# Patient Record
Sex: Female | Born: 1990 | Race: White | Hispanic: No | Marital: Single | Smoking: Never smoker
Health system: Southern US, Community
[De-identification: ages and names within clinical notes are randomized; demographics above are authoritative.]

## PROBLEM LIST (undated history)

## (undated) DIAGNOSIS — K5900 Constipation, unspecified: Secondary | ICD-10-CM

## (undated) DIAGNOSIS — F419 Anxiety disorder, unspecified: Secondary | ICD-10-CM

## (undated) DIAGNOSIS — F988 Other specified behavioral and emotional disorders with onset usually occurring in childhood and adolescence: Secondary | ICD-10-CM

## (undated) DIAGNOSIS — G809 Cerebral palsy, unspecified: Secondary | ICD-10-CM

## (undated) HISTORY — DX: Other specified behavioral and emotional disorders with onset usually occurring in childhood and adolescence: F98.8

## (undated) HISTORY — DX: Constipation, unspecified: K59.00

## (undated) HISTORY — PX: WISDOM TOOTH EXTRACTION: SHX21

## (undated) HISTORY — PX: HIP DEROTATIONAL OSTEOTOMY: SHX1753

## (undated) HISTORY — DX: Cerebral palsy, unspecified: G80.9

## (undated) HISTORY — DX: Anxiety disorder, unspecified: F41.9

---
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
## 2016-04-21 ENCOUNTER — Encounter: Payer: Self-pay | Admitting: Obstetrics and Gynecology

## 2016-04-21 ENCOUNTER — Ambulatory Visit (INDEPENDENT_AMBULATORY_CARE_PROVIDER_SITE_OTHER): Payer: PRIVATE HEALTH INSURANCE | Admitting: Obstetrics and Gynecology

## 2016-04-21 VITALS — BP 113/79 | HR 80 | Resp 18 | Ht 63.0 in | Wt 193.0 lb

## 2016-04-21 DIAGNOSIS — A499 Bacterial infection, unspecified: Secondary | ICD-10-CM | POA: Diagnosis not present

## 2016-04-21 DIAGNOSIS — B3731 Acute candidiasis of vulva and vagina: Secondary | ICD-10-CM

## 2016-04-21 DIAGNOSIS — B373 Candidiasis of vulva and vagina: Secondary | ICD-10-CM | POA: Diagnosis not present

## 2016-04-21 DIAGNOSIS — B9689 Other specified bacterial agents as the cause of diseases classified elsewhere: Secondary | ICD-10-CM

## 2016-04-21 DIAGNOSIS — N760 Acute vaginitis: Secondary | ICD-10-CM

## 2016-04-21 MED ORDER — METRONIDAZOLE 500 MG PO TABS: 500.0000 mg | ORAL_TABLET | Freq: Two times a day (BID) | ORAL | 3 refills | Status: DC

## 2016-04-21 MED ORDER — FLUCONAZOLE 150 MG PO TABS: 150.0000 mg | ORAL_TABLET | Freq: Once | ORAL | 1 refills | Status: AC

## 2016-04-21 NOTE — Progress Notes (Signed)
Obstetrics and Gynecology Visit New Patient Evaluation  Appointment Date: 04/21/2016  OBGYN Clinic: Center for [facility]  Chief Complaint:  Chief Complaint  Patient presents with  . Vaginal Discharge  and itching  History of Present Illness: Sarah Carroll is a 25 y.o. Caucasian G0 (Patient's last menstrual period was 01/19/2016.), seen for the above chief complaint. Her past medical history is significant for history of BV and VVC in the past when I saw her at [facility].  Last s/s last year and current s/s have been ongoing for the past few weeks with some malodorous vaginal discharge and itching. Diflucan and flagyl have worked in the past. She doesn't douche but does use Summer's Even wash in the GU area. She is on loloestrin and when she does have a period it sounds as if it's light, not painful and it is predictable. She would like to switch to an IUD but is waiting for her insurance to switch for better coverage. She does have some breast tenderness around the time of her periods but no discharge or lumps/bumps. She does use condoms and water based lubricants and have been using the same for several years.   No fevers, chills, nausea, vomiting, abdominal pain, dysuria, hematuria, chest pain, SOB   Review of Systems:  Her 12 point review of systems is negative or as noted in the History of Present Illness.  Past Medical History:  Past Medical History:  Diagnosis Date  . ADD (attention deficit disorder)   . Anxiety   . Cerebral palsy (HCC)   . Constipation     Past Surgical History:  Past Surgical History:  Procedure Laterality Date  . HIP DEROTATIONAL OSTEOTOMY    . WISDOM TOOTH EXTRACTION      Past Obstetrical History:  OB History  Gravida Para Term Preterm AB Living  0 0 0 0 0 0  SAB TAB Ectopic Multiple Live Births  0 0 0 0 0        Past Gynecological History: As per HPI. No abnormal paps or STIs. Paps UTD per patient  Social History:   Social History   Social History  . Marital status: Significant Other    Spouse name: N/A  . Number of children: N/A  . Years of education: N/A   Occupational History  . Not on file.   Social History Main Topics  . Smoking status: Never Smoker  . Smokeless tobacco: Never Used  . Alcohol use Yes     Comment: occasional  . Drug use: No  . Sexual activity: Yes    Birth control/ protection: Pill   Other Topics Concern  . Not on file   Social History Narrative  . No narrative on file    Family History:  Family History  Problem Relation Age of Onset  . Hypertension Father   . Hypertension Mother      Medications: lo-loestrin Allergies Morphine and related   Physical Exam:  BP 113/79 (BP Location: Left Arm, Patient Position: Sitting, Cuff Size: Normal)   Pulse 80   Resp 18   Ht 5\' 3"  (1.6 m)   Wt 193 lb (87.5 kg)   LMP 01/19/2016   BMI 34.19 kg/m  Body mass index is 34.19 kg/m. General appearance: Well nourished, well developed female in no acute distress.  Neck:  Supple, normal appearance, and no thyromegaly  Respiratory:  Normal respiratory effort Abdomen: positive bowel sounds and no masses, hernias; diffusely non tender to palpation, non distended  Neuro/Psych:  Normal mood and affect.  Skin:  Warm and dry.  Lymphatic:  No inguinal lymphadenopathy.   Pelvic exam: is not limited by body habitus EGBUS: within normal limits Vagina: within normal limits and with no blood in the vault but white, runny and clumpy discharge in vault, Cervix:  no lesions or cervical motion tenderness Uterus:  nonenlarged and approximately 6-8 week sized Adnexa:  normal adnexa and no mass, fullness, tenderness Rectovaginal: deferred  Laboratory: none  Radiology: none  Assessment: BV and VVC  Plan:  S/s aren't recurrent so will do diflucan and flagyl. Pt told if s/s recur to do repeat Rx and if no relief, then call for evaluation. Also told to d/c Summer's Eve and try Kefer  a few times a week for probiotic flora. Pt declines gc/ct testing. Pt to sign record release for [facility]  RTC prn  Cornelia Copa MD Attending Center for [facility])

## 2016-05-03 ENCOUNTER — Telehealth: Payer: Self-pay | Admitting: *Deleted

## 2016-05-03 NOTE — Telephone Encounter (Signed)
-----   Message from Olevia BowensJacinda S Battle sent at 05/03/2016  1:20 PM EDT ----- Regarding: Advise Contact: [phone] Left message with after hours answering service, Have a question about her RX

## 2016-05-03 NOTE — Telephone Encounter (Signed)
Called pt, no answer, unable to leave message due to voicemail not being set up.

## 2016-05-14 ENCOUNTER — Emergency Department: Payer: 59

## 2016-05-14 ENCOUNTER — Emergency Department
Admission: EM | Admit: 2016-05-14 | Discharge: 2016-05-14 | Payer: 59 | Attending: Emergency Medicine | Admitting: Emergency Medicine

## 2016-05-14 DIAGNOSIS — W010XXA Fall on same level from slipping, tripping and stumbling without subsequent striking against object, initial encounter: Secondary | ICD-10-CM | POA: Insufficient documentation

## 2016-05-14 DIAGNOSIS — R52 Pain, unspecified: Secondary | ICD-10-CM

## 2016-05-14 DIAGNOSIS — M25551 Pain in right hip: Secondary | ICD-10-CM | POA: Diagnosis present

## 2016-05-14 DIAGNOSIS — S79091A Other physeal fracture of upper end of right femur, initial encounter for closed fracture: Secondary | ICD-10-CM | POA: Diagnosis not present

## 2016-05-14 DIAGNOSIS — F909 Attention-deficit hyperactivity disorder, unspecified type: Secondary | ICD-10-CM | POA: Insufficient documentation

## 2016-05-14 DIAGNOSIS — M898X5 Other specified disorders of bone, thigh: Secondary | ICD-10-CM

## 2016-05-14 DIAGNOSIS — W19XXXA Unspecified fall, initial encounter: Secondary | ICD-10-CM

## 2016-05-14 DIAGNOSIS — Y92162 Bathroom in school dormitory as the place of occurrence of the external cause: Secondary | ICD-10-CM | POA: Insufficient documentation

## 2016-05-14 DIAGNOSIS — S72001A Fracture of unspecified part of neck of right femur, initial encounter for closed fracture: Secondary | ICD-10-CM

## 2016-05-14 DIAGNOSIS — Y939 Activity, unspecified: Secondary | ICD-10-CM | POA: Insufficient documentation

## 2016-05-14 DIAGNOSIS — Y999 Unspecified external cause status: Secondary | ICD-10-CM | POA: Insufficient documentation

## 2016-05-14 LAB — CBC WITH DIFFERENTIAL/PLATELET
Basophils Absolute: 0.1 10*3/uL (ref 0–0.1)
Basophils Relative: 1 %
EOS ABS: 0.1 10*3/uL (ref 0–0.7)
EOS PCT: 1 %
HCT: 38.3 % (ref 35.0–47.0)
Hemoglobin: 13.1 g/dL (ref 12.0–16.0)
LYMPHS ABS: 1.1 10*3/uL (ref 1.0–3.6)
LYMPHS PCT: 14 %
MCH: 28.5 pg (ref 26.0–34.0)
MCHC: 34.3 g/dL (ref 32.0–36.0)
MCV: 82.9 fL (ref 80.0–100.0)
MONO ABS: 0.5 10*3/uL (ref 0.2–0.9)
MONOS PCT: 6 %
Neutro Abs: 6.3 10*3/uL (ref 1.4–6.5)
Neutrophils Relative %: 78 %
PLATELETS: 246 10*3/uL (ref 150–440)
RBC: 4.62 MIL/uL (ref 3.80–5.20)
RDW: 13.5 % (ref 11.5–14.5)
WBC: 8.1 10*3/uL (ref 3.6–11.0)

## 2016-05-14 LAB — TYPE AND SCREEN
ABO/RH(D): O POS
Antibody Screen: NEGATIVE

## 2016-05-14 LAB — BASIC METABOLIC PANEL
Anion gap: 12 (ref 5–15)
BUN: 12 mg/dL (ref 6–20)
CO2: 19 mmol/L — ABNORMAL LOW (ref 22–32)
CREATININE: 0.56 mg/dL (ref 0.44–1.00)
Calcium: 9.2 mg/dL (ref 8.9–10.3)
Chloride: 109 mmol/L (ref 101–111)
GFR calc Af Amer: >60 mL/min (ref >60)
GLUCOSE: 108 mg/dL — AB (ref 65–99)
POTASSIUM: 4 mmol/L (ref 3.5–5.1)
SODIUM: 140 mmol/L (ref 135–145)

## 2016-05-14 LAB — HCG, QUANTITATIVE, PREGNANCY

## 2016-05-14 MED ORDER — DIAZEPAM 5 MG/ML IJ SOLN
3.0000 mg | Freq: Once | INTRAMUSCULAR | Status: AC
  Administered 2016-05-14: 3 mg via INTRAVENOUS

## 2016-05-14 MED ORDER — ONDANSETRON HCL 4 MG/2ML IJ SOLN
INTRAMUSCULAR | Status: AC
  Administered 2016-05-14: 4 mg via INTRAVENOUS
  Filled 2016-05-14: qty 2

## 2016-05-14 MED ORDER — FENTANYL CITRATE (PF) 100 MCG/2ML IJ SOLN
50.0000 ug | Freq: Once | INTRAMUSCULAR | Status: AC
  Administered 2016-05-14: 50 ug via INTRAVENOUS

## 2016-05-14 MED ORDER — FENTANYL CITRATE (PF) 100 MCG/2ML IJ SOLN
50.0000 ug | Freq: Once | INTRAMUSCULAR | Status: DC
  Filled 2016-05-14: qty 2

## 2016-05-14 MED ORDER — FENTANYL CITRATE (PF) 100 MCG/2ML IJ SOLN
50.0000 ug | Freq: Once | INTRAMUSCULAR | Status: AC
  Administered 2016-05-14: 50 ug via INTRAVENOUS
  Filled 2016-05-14: qty 2

## 2016-05-14 MED ORDER — HYDROMORPHONE HCL 1 MG/ML IJ SOLN
0.5000 mg | Freq: Once | INTRAMUSCULAR | Status: AC
  Administered 2016-05-14: 0.5 mg via INTRAVENOUS
  Filled 2016-05-14: qty 1

## 2016-05-14 MED ORDER — DIAZEPAM 5 MG/ML IJ SOLN
3.0000 mg | Freq: Once | INTRAMUSCULAR | Status: AC
  Administered 2016-05-14: 3 mg via INTRAVENOUS
  Filled 2016-05-14: qty 2

## 2016-05-14 MED ORDER — HYDROMORPHONE HCL 1 MG/ML IJ SOLN
1.0000 mg | Freq: Once | INTRAMUSCULAR | Status: AC
  Administered 2016-05-14: 1 mg via INTRAVENOUS
  Filled 2016-05-14: qty 1

## 2016-05-14 MED ORDER — HYDROMORPHONE HCL 1 MG/ML IJ SOLN
1.0000 mg | Freq: Once | INTRAMUSCULAR | Status: AC
  Administered 2016-05-14: 1 mg via INTRAVENOUS

## 2016-05-14 MED ORDER — HYDROMORPHONE HCL 1 MG/ML IJ SOLN
1.0000 mg | Freq: Once | INTRAMUSCULAR | Status: AC
Start: 2016-05-14 — End: 2016-05-14
  Administered 2016-05-14: 1 mg via INTRAVENOUS
  Filled 2016-05-14: qty 1

## 2016-05-14 MED ORDER — HYDROMORPHONE HCL 1 MG/ML IJ SOLN
INTRAMUSCULAR | Status: AC
Start: 2016-05-14 — End: 2016-05-14
  Administered 2016-05-14: 1 mg via INTRAVENOUS
  Filled 2016-05-14: qty 1

## 2016-05-14 MED ORDER — ONDANSETRON HCL 4 MG/2ML IJ SOLN
4.0000 mg | Freq: Once | INTRAMUSCULAR | Status: AC
  Administered 2016-05-14: 4 mg via INTRAVENOUS

## 2016-05-14 MED ORDER — DIAZEPAM 5 MG/ML IJ SOLN
2.0000 mg | Freq: Once | INTRAMUSCULAR | Status: AC
  Administered 2016-05-14: 2 mg via INTRAVENOUS
  Filled 2016-05-14: qty 2

## 2016-05-14 NOTE — ED Notes (Signed)
Called [facility] to let them know patient en route to facility.

## 2016-05-14 NOTE — ED Notes (Signed)
Pt reports increased muscle spasms and pain. MD notified. Will give pt more medication.

## 2016-05-14 NOTE — ED Notes (Signed)
Pt transported to xray 

## 2016-05-14 NOTE — Consult Note (Signed)
ORTHOPAEDIC CONSULTATION  REQUESTING PHYSICIAN: Minna Antis, MD ER physician  Chief Complaint: Right hip pain status post fall  HPI: Sarah Carroll is a 25 y.o. female who complains of right hip pain status post fall at school today.  Patient states that she was walking into the bathroom and believes she caught her right foot on the threshold of the doorway which caused her to lose her balance. She fell onto the right side and had immediate pain in the right hip. Patient has a history of cerebral palsy with a prior derotational osteotomy of the right proximal femur performed at [facility] by Dr.Campion 8 years ago. Patient has a plate and screws in the right femur. Patient states that she has a chronic hip flexion contracture but does walk independently without an assistive device. She is able to bear weight on her right lower extremity. Patient denies any numbness or tingling in the lower extremity. She is seen in the ER room 1 with her family at the bedside.  Past Medical History:  Diagnosis Date  . ADD (attention deficit disorder)   . Anxiety   . Cerebral palsy (HCC)   . Constipation    Past Surgical History:  Procedure Laterality Date  . HIP DEROTATIONAL OSTEOTOMY    . WISDOM TOOTH EXTRACTION     Social History   Social History  . Marital status: Significant Other    Spouse name: N/A  . Number of children: N/A  . Years of education: N/A   Social History Main Topics  . Smoking status: Never Smoker  . Smokeless tobacco: Never Used  . Alcohol use Yes     Comment: occasional  . Drug use: No  . Sexual activity: Yes    Birth control/ protection: Pill   Other Topics Concern  . None   Social History Narrative  . None   Family History  Problem Relation Age of Onset  . Hypertension Father   . Hypertension Mother    Allergies  Allergen Reactions  . Morphine And Related Itching   Prior to Admission medications   Medication Sig Start Date End Date Taking? Authorizing  Provider  Norethindrone-Ethinyl Estradiol-Fe Biphas (LO LOESTRIN FE) 1 MG-10 MCG / 10 MCG tablet Take 1 tablet by mouth daily. 06/11/15  Yes Historical Provider, MD  metroNIDAZOLE (FLAGYL) 500 MG tablet Take 1 tablet (500 mg total) by mouth 2 (two) times daily. Patient not taking: Reported on 05/14/2016 04/21/16   [place] Bing, MD   Ct Femur Right Wo Contrast  Result Date: 05/14/2016 CLINICAL DATA:  Right hip pain secondary to a fall at school today. Previous derotational osteotomy on the proximal right femur. EXAM: CT OF THE RIGHT FEMUR WITHOUT CONTRAST TECHNIQUE: Multidetector CT imaging was performed according to the standard protocol. Multiplanar CT image reconstructions were also generated. COMPARISON:  Radiographs dated 05/14/2016 FINDINGS: There is a comminuted slightly impacted intertrochanteric fracture of the proximal right femur. The fracture also extends 11 cm down the femoral shaft from the tip of the greater trochanter. There is a slightly expansile the 7 x 4 x 4 cm cystic lesion in the greater trochanteric area of the proximal femur extending into the right femoral neck and shaft. 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 There is a side plate with multiple screws in the proximal right femur. Osteotomy site has completely healed. Distal femur is osteopenic but intact. Visualized pelvic bones are normal. IMPRESSION: Comminuted impacted fracture of the proximal right femur. 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 Electronically Signed   By: Francene BoyersJames  Maxwell M.D.   On: 05/14/2016 15:47   Dg Femur Min 2 Views Right  Result Date: 05/14/2016 CLINICAL DATA:  Fall. EXAM: RIGHT FEMUR 2 VIEWS COMPARISON:  No recent prior. FINDINGS: Plate and screw fixation proximal femur. No acute bony abnormality identified. Deformity noted about the hip and knee consistent patient's known cervical palsy . IMPRESSION: No acute abnormality.  Electronically Signed   By: Maisie Fushomas  Register   On: 05/14/2016 12:41    Positive ROS: All other systems have been reviewed and were otherwise negative with the exception of those mentioned in the HPI and as above.  Physical Exam: General: Alert, no acute distress  MUSCULOSKELETAL: Right lower extremity: Patient is laying on her left side. Her right lower extremity is laying on top of the left lower extremity with flexion of the hip of approximately 45 and the knee approximately 90. She has intact skin. Distally she has palpable pedal pulses, intact sensation light touch and can flex and extend her toes. There is no shortening or external rotation of the right lower extremity seen.  Assessment: Right proximal femur fracture Cerebral palsy affecting right lower extremity Interosseus lesion in the proximal femur  Plan: I reviewed the x-rays and the CT scan of the right hip taken in the ER. The patient has a fracture of the proximal femur which extends into the intertrochanteric region and exits medially near the base of the femoral neck. The fracture extends into the femoral shaft 11 mm distal to the greater trochanter alongside her osteotomy plate.  The plate does not appear to be loosened. Patient has a significant expansile lesion of the proximal femur extending into the femoral neck and head and does extend into the region of the fracture. The radiologist believes this may be an aneurysmal bone cyst. However and interosseous expansile tumor cannot be excluded including fibrous dysplasia or other neoplasm.  I have explained the findings of these radiologic studies to the patient and her family. She will likely need surgery to involve removal of her existing plate and fixation of the fracture which will include extension more distally with an intramedullary rod versus long plate. The interosseus lesion will likely need to be identified and will likely require biopsy at the time of surgery. Her  cerebral palsy adds additional surgical challenges based on increased muscle tone and flexion contractures. For all these reasons I'm recommending the patient be transferred to [facility] for definitive management of her fracture. I have discussed this case with Dr. Lenard LancePaduchowski who will arrange for transfer. The family understood and agreed with this plan. They understand that if a [facility] is not available to accept transfer of the patient, that she will be admitted here for pain control and a further treatment plan devised.Marland Kitchen.    Juanell FairlyKRASINSKI, Elmon Shader, MD    05/14/2016 5:18 PM

## 2016-05-14 NOTE — ED Triage Notes (Addendum)
Pt came to ED via EMS from [facility]. Was at school when had a mechanical fall. C/o right hip pain. History of CP and right hip surgery 15 years ago. Given 100mcg fentanyl.

## 2016-05-14 NOTE — ED Provider Notes (Signed)
[facility] Emergency Department Provider Note   ____________________________________________   First MD Initiated Contact with Patient 05/14/16 1048     (approximate)  I have reviewed the triage vital signs and the nursing notes.   HISTORY  Chief Complaint Hip Pain and Fall   HPI Sarah Carroll is a 25 y.o. female with a history of cerebral palsy as well as right-sided hip surgery with hardware at 25 years old who is presenting to the emergency department after a fall today with right femur pain She said that she had a trip and fall the bathroom at her school. She landed on her right femur and hip and has been unable to move it since without a great degree of pain. She denies hitting her head or any other part of her body and says that the pain is concentrated to the right femur. Given 100 g of fentanyl en route with EMS.   Past Medical History:  Diagnosis Date  . ADD (attention deficit disorder)   . Anxiety   . Cerebral palsy (HCC)   . Constipation     There are no active problems to display for this patient.   Past Surgical History:  Procedure Laterality Date  . HIP DEROTATIONAL OSTEOTOMY    . WISDOM TOOTH EXTRACTION      Prior to Admission medications   Medication Sig Start Date End Date Taking? Authorizing Provider  Norethindrone-Ethinyl Estradiol-Fe Biphas (LO LOESTRIN FE) 1 MG-10 MCG / 10 MCG tablet Take 1 tablet by mouth daily. 06/11/15  Yes Historical Provider, MD  metroNIDAZOLE (FLAGYL) 500 MG tablet Take 1 tablet (500 mg total) by mouth 2 (two) times daily. Patient not taking: Reported on 05/14/2016 04/21/16   [place] Bingharlie Pickens, MD    Allergies Morphine and related  Family History  Problem Relation Age of Onset  . Hypertension Father   . Hypertension Mother     Social History Social History  Substance Use Topics  . Smoking status: Never Smoker  . Smokeless tobacco: Never Used  . Alcohol use Yes     Comment: occasional     Review of Systems Constitutional: No fever/chills Eyes: No visual changes. ENT: No sore throat. Cardiovascular: Denies chest pain. Respiratory: Denies shortness of breath. Gastrointestinal: No abdominal pain.  No nausea, no vomiting.  No diarrhea.  No constipation. Genitourinary: Negative for dysuria. Musculoskeletal: Negative for back pain. Skin: Negative for rash. Neurological: Negative for headaches, focal weakness or numbness.  10-point ROS otherwise negative.  ____________________________________________   PHYSICAL EXAM:  VITAL SIGNS: ED Triage Vitals [05/14/16 1044]  Enc Vitals Group     BP 134/90     Pulse Rate 90     Resp 16     Temp 98.3 F (36.8 C)     Temp Source Oral     SpO2 100 %     Weight      Height      Head Circumference      Peak Flow      Pain Score      Pain Loc      Pain Edu?      Excl. in GC?     Constitutional: Alert and oriented. Well appearing and in no acute distress.Lying on her left side to avoid pressure to the right femur and hip. Eyes: Conjunctivae are normal. PERRL. EOMI. Head: Atraumatic. Nose: No congestion/rhinnorhea. Mouth/Throat: Mucous membranes are moist.   Neck: No stridor.   Cardiovascular: Normal rate, regular rhythm. Grossly normal  heart sounds.  Good peripheral circulation with equal and bilateral dorsalis pedis pulses. Respiratory: Normal respiratory effort.  No retractions. Lungs CTAB. Gastrointestinal: Soft and nontender. No distention.  Musculoskeletal: No deformity or ecchymosis noted to the right hip or femur. Patient is holding her right lower extremity in flexion at both the hip and the knee. She is tender to the mid shaft femur laterally. She is neurovascularly intact distal to the site of the pain. I'm unable to passively range the right lower extremity secondary to pain To the mid shaft femur.  Neurologic:  Normal speech and language. No gross focal neurologic deficits are appreciated.  Skin:  Skin is  warm, dry and intact. No rash noted. Psychiatric: Mood and affect are normal. Speech and behavior are normal.  ____________________________________________   LABS (all labs ordered are listed, but only abnormal results are displayed)  Labs Reviewed  BASIC METABOLIC PANEL - Abnormal; Notable for the following:       Result Value   CO2 19 (*)    Glucose, Bld 108 (*)    All other components within normal limits  CBC WITH DIFFERENTIAL/PLATELET  HCG, QUANTITATIVE, PREGNANCY  TYPE AND SCREEN   ____________________________________________  EKG   ____________________________________________  RADIOLOGY  DG Femur Min 2 Views Right (Accession [id number]) (Order [id number])  Imaging  Date: 05/14/2016 Department: [facility] EMERGENCY DEPARTMENT Released By/Authorizing: Myrna Blazer, MD (auto-released)  PACS Images   Show images for DG Femur Min 2 Views Right  Study Result   CLINICAL DATA:  Fall.  EXAM: RIGHT FEMUR 2 VIEWS  COMPARISON:  No recent prior.  FINDINGS: Plate and screw fixation proximal femur. No acute bony abnormality identified. Deformity noted about the hip and knee consistent patient's known cervical palsy .  IMPRESSION: No acute abnormality.   Electronically Signed   By: Maisie Fus  Register   On: 05/14/2016 12:41    ____________________________________________   PROCEDURES  Procedure(s) performed:   Procedures  Critical Care performed:   ____________________________________________   INITIAL IMPRESSION / ASSESSMENT AND PLAN / ED COURSE  Pertinent labs & imaging results that were available during my care of the patient were reviewed by me and considered in my medical decision making (see chart for details).  ----------------------------------------- 3:23 PM on 05/14/2016 -----------------------------------------  Discussed case with Dr. Lenard Lance who will be following the patient's imaging. Negative  x-rays the patient requiring multiple rounds of pain medications as well as muscle relaxers. Suspect possible occult fracture not seen on the patient's x-rays.  Clinical Course     ____________________________________________   FINAL CLINICAL IMPRESSION(S) / ED DIAGNOSES  Final diagnoses:  Pain in right femur  Mechanical fall.    NEW MEDICATIONS STARTED DURING THIS VISIT:  New Prescriptions   No medications on file     Note:  This document was prepared using Dragon voice recognition software and may include unintentional dictation errors.    Myrna Blazer, MD 05/14/16 [phone]

## 2016-05-14 NOTE — ED Provider Notes (Signed)
-----------------------------------------   7:41 PM on 05/14/2016 -----------------------------------------  CT scan consistent with comminuted impacted fracture of the proximal right femur. I discussed the patient with Dr. Martha ClanKrasinski who believes the repair will be too complicated/complex and the patient would benefit from transfer to a tertiary care center. I [facility] the [facility] as [facility] and the patient was followed by Dr. Adah Salvageampion up until a few years ago. I discussed the patient with [facility] was excepted the patient pending an ER to ER transfer. I discussed the patient with Dr. Mayford KnifeWilliams in the emergency Department has accepted the patient ER to ER until a bed becomes available for the patient. I discussed this with the patient and her family and they're both agreeable to this plan.   Minna AntisKevin Naevia Unterreiner, MD 05/14/16 [phone]

## 2016-05-17 ENCOUNTER — Encounter: Payer: Self-pay | Admitting: *Deleted

## 2016-06-21 ENCOUNTER — Telehealth: Payer: Self-pay | Admitting: *Deleted

## 2016-06-21 DIAGNOSIS — N760 Acute vaginitis: Secondary | ICD-10-CM

## 2016-06-21 MED ORDER — FLUCONAZOLE 150 MG PO TABS: 150.0000 mg | ORAL_TABLET | Freq: Once | ORAL | 0 refills | Status: AC

## 2016-06-21 MED ORDER — METRONIDAZOLE 500 MG PO TABS: 500.0000 mg | ORAL_TABLET | Freq: Two times a day (BID) | ORAL | 0 refills | Status: DC

## 2016-06-21 NOTE — Telephone Encounter (Signed)
Pt called c/o white vaginal discharge and vaginal odor.  Is currently recovering from a fractured femur.  Has a hx of recurrent vaginitis.  Sent Flagyl and Diflucan to pharmacy.  Instructed pt on medication use and instructed that after she heals if she continues to have recurrent infections to call and schedule appt for evaluation.

## 2016-07-07 ENCOUNTER — Encounter: Payer: Self-pay | Admitting: Physical Therapy

## 2016-07-07 ENCOUNTER — Ambulatory Visit: Payer: PRIVATE HEALTH INSURANCE | Attending: Orthopedic Surgery | Admitting: Physical Therapy

## 2016-07-07 DIAGNOSIS — M25551 Pain in right hip: Secondary | ICD-10-CM | POA: Diagnosis not present

## 2016-07-07 DIAGNOSIS — M6281 Muscle weakness (generalized): Secondary | ICD-10-CM

## 2016-07-07 DIAGNOSIS — R262 Difficulty in walking, not elsewhere classified: Secondary | ICD-10-CM

## 2016-07-07 NOTE — Patient Instructions (Signed)
[email or web address]  R SLR with assistance through full range Bridging Continue current exercises: quad set, supine hip abd, SAQ

## 2016-07-07 NOTE — Therapy (Signed)
[facility] PHYSICAL AND SPORTS MEDICINE [address] [place], [place], [address] Phone: [phone]   Fax:  [phone]  Physical Therapy Evaluation  Patient Details  Name: Sarah ChromanMadison E Rief MRN: [id number] Date of Birth: 05/25/1991 Referring Provider: Brion Alimentobert Ostrum  Encounter Date: 07/07/2016      PT End of Session - 07/07/16 1603    Visit Number 1   Number of Visits 17   Date for PT Re-Evaluation 08/04/16   PT Start Time 1432   PT Stop Time 1532   PT Time Calculation (min) 60 min   Equipment Utilized During Treatment Gait belt   Activity Tolerance Patient tolerated treatment well   Behavior [facility] Therapy [facility] for tasks assessed/performed      Past Medical History:  Diagnosis Date  . ADD (attention deficit disorder)   . Anxiety   . Cerebral palsy (HCC)   . Constipation     Past Surgical History:  Procedure Laterality Date  . HIP DEROTATIONAL OSTEOTOMY    . WISDOM TOOTH EXTRACTION      There were no vitals filed for this visit.       Subjective Assessment - 07/07/16 1554    Subjective s/p R femur fracture and internal fixation   Pertinent History Pt reports falling during the first week of school while in the bathroom. She "opened the bathroom door kinda weird and stepped into it kinda weird" and she thinks the door pushed her over, causing her to fall directly onto her R hip. Prior to the fall, she did not ambulate with an AD. She states that she was told to wear AFOs, but she just never did because they were hard to put on. She wore them until she was 25 YO. She states she did not drive prior to the fall but thinks she will be able to after rehab. She reports living with her parents, but was fully independent. She states prior to the fall, she could walk as much as she wanted to. Following surgery, [facility]; she was discharged 06/05/16. She has just been WBAT since coming home from rehab and was  trying to walk as much as possible with her FWW. She states that she has been trying to stretch it out as well. 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 She states that she needs some quad strengthening. She denies any falls since the surgery. She reports having some pain, especially after doing a lot, but for the most part it is just achy. She has a ramp at her house, but at her boyfriend's house there are 2 STE which she can ambulate with her FWW. She had a previous surgery on her RLE when she was 9 or 25 YO, where they had to replace a plate with a rod.   Limitations Walking;Standing;House hold activities   How long can you walk comfortably? 200-300 ft   Patient Stated Goals walk without AD, get back to how she was before surgery   Currently in Pain? No/denies            [facility] Assessment - 07/07/16 0001      Assessment   Medical Diagnosis s/p femur surgery (rebuilt femur)   Referring Provider Brion Alimentobert Ostrum   Onset Date/Surgical Date 05/17/16   Prior Therapy yes, but not for current issue     Precautions   Precautions  Fall     Restrictions   Weight Bearing Restrictions No     Balance Screen   Has the patient fallen in the past 6 months Yes   How many times? 1   Has the patient had a decrease in activity level because of a fear of falling?  Yes   Is the patient reluctant to leave their home because of a fear of falling?  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   Leisure movies, visiting with friends     Cognition   Overall Cognitive Status Within Functional Limits for tasks assessed      SENSATION  WNL BLE  A/PROM  Left Right   Hip flexion WNL AROM limited due to weakness; PROM to 90 deg before pt reported anterior hip pain/"pinching"  Hip extension WNL WNL  Hip IR/ER    Hip abduction WNL AROM limited due to weakness  Hip adduction    Knee flexion WNL WNL  Knee extension +12 hyper -2 from neutral   Ankle DF Limited due to weakness, baseline Limited due to weakness, baseline        STRENGTH (on scale of 0-5/5)  Left Right  Hip flexion 4+ 2+ (unable to perform through full range, but pt had good strength through available range)  Hip extension 4+ 3  Hip IR/ER    Hip abduction 4+ 2+ (unable to perform through full range)  Hip adduction    Knee flexion 5 4  Knee extension 5 4  Ankle DF 4+ 4  Ankle PF             Pt unable to perform R SLR without assistance due to weakness  PALPATION/OBSERVATION Decreased quad set on R compared to L Incisions are well healed  GAIT Pt ambulates with FWW and demonstrates increased foot pronation, toe out, knee valgus, hip IR, trendelenberg, pelvic rotation, and decreased DF on the RLE; decreased DF, increased knee hyperextension, knee valgus on the LLE  Pt able to ambulate with quad cane during session this date with same deficits as above, but at slower pace and required mod verbal cues for proper sequencing   BALANCE Not assessed this date, will assess during future sessions  OUTCOME MEASURES: : 628 ft with FWW 5xSTS: 16.03 sec, no UE support   Therex: Went over HEP: R SLR and bridging Continue performing supine hip abd, quad sets, and SAQ on RLE         PT Education - 07/07/16 1603    Education provided Yes   Education Details POC, HEP   Person(s) Educated Patient   Methods Explanation   Comprehension Verbalized understanding             PT Long Term  Goals - 07/07/16 1615      PT LONG TERM GOAL #1   Title Pt will have improved 6 minute walk test by >568ft with LRAD to demonstrate improved strength, endurance, and overall function and community  ambulation.   Baseline 10/18: 6105ft with FWW   Time 8   Period Weeks   Status New     PT LONG TERM GOAL #2   Title Pt will have improved 5xSTS to <10 seconds to demonstrate improved overall strength and function.   Baseline 10/18: 16.03 seconds   Time 8   Period Weeks   Status New     PT LONG TERM GOAL #3   Title Pt will have improved LEFS score by >10 points to demonstrate improved overall function.   Baseline 10/18: 21/80   Time 8   Period Weeks   Status New               Plan - 07/07/16 1604    Clinical Impression Statement Pt is pleasant 25 YO F with CP who presents to therapy s/p R femur fracture after a fall while in the bathroom at school. She had a rod inserted on 05/17/16. Pt is WBAT on RLE and is currently ambulating with FWW. Pt has deficits in R quad and hip flexion strength, AROM due to weakness, functional strength and in gait. Pt was completely independent prior to her fall and could ambulate without and AD, but is currently ambulating with a FWW. She was able to ambulate with a quad cane this date with SBA. Pt needs skilled PT intervention to maximize strength and overall function.   Rehab Potential Good   Clinical Impairments Affecting Rehab Potential young, motivated, active   PT Frequency 2x / week   PT Duration 8 weeks   PT Treatment/Interventions ADLs/Self Care Home Management;Cryotherapy;Moist Heat;Ultrasound;DME Instruction;Gait training;Stair training;Functional mobility training;Therapeutic activities;Therapeutic exercise;Electrical Stimulation;Balance training;Neuromuscular re-education;Patient/family education;Orthotic Fit/Training;Manual techniques;Passive range of motion   PT Next Visit Plan strengthening, stretching, gait training   PT Home Exercise Plan  10/18: assisted SLR, bridging, and to continue her current HEP (quad sets, SAQ, supine hip abd)   Consulted and Agree with Plan of Care Patient      Patient will benefit from skilled therapeutic intervention in order to improve the following deficits and impairments:  Abnormal gait, Decreased activity tolerance, Decreased balance, Decreased mobility, Decreased endurance, Decreased range of motion, Decreased strength, Difficulty walking, Impaired flexibility  Visit Diagnosis: Pain in right hip  Difficulty in walking, not elsewhere classified  Muscle weakness (generalized)     Problem List There are no active problems to display for this patient.  Jac Canavan, SPT  Jac Canavan 07/07/2016, 4:21 PM  [facility] PHYSICAL AND SPORTS MEDICINE [address] [place], [address] Phone: [phone]   Fax:  [phone]  Name: DIEDRE MACLELLAN MRN: [id number] Date of Birth: 1990/11/06

## 2016-07-12 ENCOUNTER — Ambulatory Visit: Payer: PRIVATE HEALTH INSURANCE | Admitting: Physical Therapy

## 2016-07-14 ENCOUNTER — Ambulatory Visit: Payer: PRIVATE HEALTH INSURANCE | Admitting: Physical Therapy

## 2016-07-14 DIAGNOSIS — M25551 Pain in right hip: Secondary | ICD-10-CM

## 2016-07-14 DIAGNOSIS — M6281 Muscle weakness (generalized): Secondary | ICD-10-CM

## 2016-07-14 DIAGNOSIS — R262 Difficulty in walking, not elsewhere classified: Secondary | ICD-10-CM

## 2016-07-14 NOTE — Patient Instructions (Addendum)
Toe taps to the side  Gait assessment -   TKE's   Sit to stand from green chair   Sit to stands x 15 (with red band around thighs)   TRX Sit to stands x 15 for 2 sets with red theraband

## 2016-07-15 NOTE — Therapy (Signed)
[facility] PHYSICAL AND SPORTS MEDICINE [address], [place], [address] Phone: [phone]   Fax:  [phone]  Physical Therapy Treatment  Patient Details  Name: Sarah Carroll MRN: [id number] Date of Birth: 24-Apr-1991 Referring Provider: Brion Aliment  Encounter Date: 07/14/2016      PT End of Session - 07/14/16 1030    Visit Number 2   Number of Visits 17   Date for PT Re-Evaluation 08/04/16   PT Start Time 1000   PT Stop Time 1035   PT Time Calculation (min) 35 min   Equipment Utilized During Treatment Gait belt   Activity Tolerance Patient tolerated treatment well   Behavior During Therapy [facility] for tasks assessed/performed      Past Medical History:  Diagnosis Date  . ADD (attention deficit disorder)   . Anxiety   . Cerebral palsy (HCC)   . Constipation     Past Surgical History:  Procedure Laterality Date  . HIP DEROTATIONAL OSTEOTOMY    . WISDOM TOOTH EXTRACTION      There were no vitals filed for this visit.      Subjective Assessment - 07/14/16 1003    Subjective Patient reports having had 2 falls since her evaluation. She has been getting a muscle sore since her evaluation, no residual pain from the falls. She continues to use RW for all mobility.    Pertinent History Pt reports falling during the first week of school while in the bathroom. She "opened the bathroom door kinda weird and stepped into it kinda weird" and she thinks the door pushed her over, causing her to fall directly onto her R hip. Prior to the fall, she did not ambulate with an AD. She states that she was told to wear AFOs, but she just never did because they were hard to put on. She wore them until she was 25 YO. She states she did not drive prior to the fall but thinks she will be able to after rehab. She reports living with her parents, but was fully independent. She states prior to the fall, she could walk as much as she wanted to. Following  surgery, she went to a rehab facility at [facility] for 1 week; she was discharged 06/05/16. She has just been WBAT since coming home from rehab and was trying to walk as much as possible with her FWW. She states that she has been trying to stretch it out as well. 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 She states that she needs some quad strengthening. She denies any falls since the surgery. She reports having some pain, especially after doing a lot, but for the most part it is just achy. She has a ramp at her house, but at her boyfriend's house there are 2 STE which she can ambulate with her FWW. She had a previous surgery on her RLE when she was 9 or 25 YO, where they had to replace a plate with a rod.   Limitations Walking;Standing;House hold activities   How long can you walk comfortably? 200-300 ft   Patient Stated Goals walk without AD, get back to how she was before surgery   Currently in Pain? No/denies  Patient reports she has soreness in her R thigh (posterior thigh musculature, anterior proximal thigh) from exercises last night.       Toe taps to the side bilaterally with HHA from walker  x 15 for 3 sets bilaterally  Gait assessment - crouched gait with knee flexion R>L and Trendelenburg R>L along with pronation at bilateral LEs.   TKE's w/ red t-band x 12 for 3 sets on RLE to facilitate increased R knee extension in upright position  Sit to stand from green chair x 10 for 2 sets (to the point of fatigue with no use of UEs)   Sit to stands x 15 (with red band around thighs)   TRX Sit to stands x 15 for 2 sets with red theraband around knees. At this point patient was quite fatigued, thus dc'd therapy for the day.   HEP including SLS, sit to stands with band around knees, TKEs, lateral toe taps.                            PT Education - 07/14/16 1055    Education provided Yes   Education Details HEP, discussed  patient insurance information and need to discuss long term financial planning.    Person(s) Educated Patient   Methods Explanation;Handout;Demonstration;Tactile cues   Comprehension Verbalized understanding;Returned demonstration;Verbal cues required             PT Long Term Goals - 07/07/16 1615      PT LONG TERM GOAL #1   Title Pt will have improved 6 minute walk test by >56900ft with LRAD to demonstrate improved strength, endurance, and overall function and community ambulation.   Baseline 10/18: 64428ft with FWW   Time 8   Period Weeks   Status New     PT LONG TERM GOAL #2   Title Pt will have improved 5xSTS to <10 seconds to demonstrate improved overall strength and function.   Baseline 10/18: 16.03 seconds   Time 8   Period Weeks   Status New     PT LONG TERM GOAL #3   Title Pt will have improved LEFS score by >10 points to demonstrate improved overall function.   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 She has been excellent with HEP and was provided with increased HEP to address listed deficits in gait pattern. She has had 2 falls since evaluation, underscoring the importance of increased strengthening, gait training, and higher level balance work.    Rehab Potential Good   Clinical Impairments Affecting Rehab Potential young, motivated, active   PT Frequency 2x / week   PT Duration 8 weeks   PT Treatment/Interventions ADLs/Self Care Home Management;Cryotherapy;Moist Heat;Ultrasound;DME Instruction;Gait training;Stair training;Functional mobility training;Therapeutic activities;Therapeutic exercise;Electrical Stimulation;Balance training;Neuromuscular re-education;Patient/family  education;Orthotic Fit/Training;Manual techniques;Passive range of motion   PT Next Visit Plan strengthening, stretching, gait training   PT Home Exercise Plan 10/18: assisted SLR, bridging, and to continue her current HEP (quad sets, SAQ, supine hip abd)   Consulted and Agree with Plan of Care Patient      Patient will benefit from skilled therapeutic intervention in order to improve the following deficits and impairments:  Abnormal gait, Decreased activity tolerance, Decreased balance, Decreased mobility, Decreased endurance, Decreased range of motion, Decreased strength, Difficulty walking, Impaired flexibility  Visit Diagnosis: Pain in right hip  Difficulty in walking, not elsewhere classified  Muscle weakness (generalized)     Problem List There are no active problems to display for this patient.   Kerin Ransom, PT, DPT    07/15/2016, 4:35 PM  [facility] PHYSICAL AND SPORTS MEDICINE [address], [place], [address] Phone: [phone]   Fax:  [phone]  Name: Sarah Carroll MRN: [id number] Date of Birth: [birth year]

## 2016-07-16 ENCOUNTER — Ambulatory Visit (INDEPENDENT_AMBULATORY_CARE_PROVIDER_SITE_OTHER): Payer: PRIVATE HEALTH INSURANCE | Admitting: Obstetrics and Gynecology

## 2016-07-16 ENCOUNTER — Encounter: Payer: Self-pay | Admitting: Obstetrics and Gynecology

## 2016-07-16 VITALS — BP 112/77 | HR 76 | Wt 184.0 lb

## 2016-07-16 DIAGNOSIS — B373 Candidiasis of vulva and vagina: Secondary | ICD-10-CM | POA: Diagnosis not present

## 2016-07-16 DIAGNOSIS — Z124 Encounter for screening for malignant neoplasm of cervix: Secondary | ICD-10-CM

## 2016-07-16 DIAGNOSIS — N898 Other specified noninflammatory disorders of vagina: Secondary | ICD-10-CM

## 2016-07-16 DIAGNOSIS — B3731 Acute candidiasis of vulva and vagina: Secondary | ICD-10-CM

## 2016-07-16 DIAGNOSIS — N760 Acute vaginitis: Secondary | ICD-10-CM | POA: Diagnosis not present

## 2016-07-16 DIAGNOSIS — Z113 Encounter for screening for infections with a predominantly sexual mode of transmission: Secondary | ICD-10-CM

## 2016-07-16 DIAGNOSIS — B9689 Other specified bacterial agents as the cause of diseases classified elsewhere: Secondary | ICD-10-CM | POA: Diagnosis not present

## 2016-07-16 MED ORDER — FLUCONAZOLE 100 MG PO TABS: ORAL_TABLET | ORAL | 1 refills | Status: DC

## 2016-07-16 MED ORDER — NORETHIN-ETH ESTRAD-FE BIPHAS 1 MG-10 MCG / 10 MCG PO TABS: 1.0000 | ORAL_TABLET | Freq: Every day | ORAL | 1 refills | Status: DC

## 2016-07-16 MED ORDER — METRONIDAZOLE 0.75 % EX GEL: CUTANEOUS | 2 refills | Status: DC

## 2016-07-16 NOTE — Progress Notes (Signed)
Obstetrics and Gynecology Visit Problem Visit Patient Evaluation  Appointment Date: 07/16/2016  [facility]  [facility]  Referring Provider: Marina GoodellFeldpausch, Dale E, MD  Chief Complaint: persistent vaginal discharge and itching  History of Present Illness: Sarah Carroll is a 25 y.o. Caucasian G0 (No LMP recorded. Patient is not currently having periods (Reason: Oral contraceptives).), seen for the above chief complaint. Her past medical history is significant for recurrent BV and yeast infections; see prior notes  Interval History: approximately one week after her visit with me and taking the diflucan and flagyl, she broke her leg and was in the hospital for awhile and not sure if the diflucan and flagyl worked. She is recovering well, but had to get surgical antibiotics during her hospitalization.  She had repeat course called in on 10/2 but feels that it either helped slightly or none at all. +vaginal itching and discharge but no VB, fevers, chills, dysuria.   [facility] she had special swab testing by another provider that came back at [facility]; she's also tried hylafem and terazol in the past  Review of Systems: Her 12 point review of systems is negative or as noted in the History of Present Illness.  Past Medical History:  Past Medical History:  Diagnosis Date  . ADD (attention deficit disorder)   . Anxiety   . Cerebral palsy (HCC)   . Constipation     Past Surgical History:  Past Surgical History:  Procedure Laterality Date  . HIP DEROTATIONAL OSTEOTOMY    . WISDOM TOOTH EXTRACTION      Past Obstetrical History:  OB History  Gravida Para Term Preterm AB Living  0 0 0 0 0 0  SAB TAB Ectopic Multiple Live Births  0 0 0 0 0        Past Gynecological History: As per HPI. No h/o STIs or abnormal paps.   Social History:  Social History   Social  History  . Marital status: Significant Other    Spouse name: N/A  . Number of children: N/A  . Years of education: N/A   Occupational History  . Not on file.   Social History Main Topics  . Smoking status: Never Smoker  . Smokeless tobacco: Never Used  . Alcohol use Yes     Comment: occasional  . Drug use: No  . Sexual activity: Yes    Birth control/ protection: Pill   Other Topics Concern  . Not on file   Social History Narrative  . No narrative on file    Family History:  Family History  Problem Relation Age of Onset  . Hypertension Father   . Hypertension Mother      Medications Ms. Nedra HaiLee had no medications administered during this visit. Current Outpatient Prescriptions  Medication Sig Dispense Refill  . aspirin EC 81 MG tablet Take 81 mg by mouth.    . cyclobenzaprine (FLEXERIL) 10 MG tablet Take 10 mg by mouth.    . metroNIDAZOLE (FLAGYL) 500 MG tablet Take 1 tablet (500 mg total) by mouth 2 (two) times daily. 14 tablet 0  . Norethindrone-Ethinyl Estradiol-Fe Biphas (LO LOESTRIN FE) 1 MG-10 MCG / 10 MCG tablet Take 1 tablet by mouth daily. 1 Package 1  . Vitamin D, Ergocalciferol, (DRISDOL) 50000 units CAPS capsule Take by mouth.     No current facility-administered medications for this visit.  Allergies Morphine and related   Physical Exam:  BP 112/77 (BP Location: Left Arm)   Pulse 76   Wt 184 lb (83.5 kg)   BMI 32.59 kg/m  Body mass index is 32.59 kg/m. General appearance: Well nourished, well developed female in no acute distress.  Respiratory:   Normal respiratory effort Abdomen:diffusely non tender to palpation, non distended Neuro/Psych:  Normal mood and affect.  Skin:  Warm and dry.  Lymphatic:  No inguinal lymphadenopathy.   Pelvic exam: is not limited by body habitus EGBUS wnl with slight b/l erythema at the introitus and moderate amount of white clumpy discharge. Same discharge and slight white d/c in the vaginal vault. No VB and no  CMT  Laboratory: None  Radiology: none  Assessment: pt with recurrent vulvovaginal yeast infection and BV  Plan:  Pap smear and GC/CT/trich obtained and updated BD affirm sent off with request for yeast culture. In the interim, will tx with metrogel and diflucan with recurrent infection dosing until results are back.  Pt interested in larc but not sure yet. Lo loestrin sent in for another two months.   RTC based on culture results  Cornelia Copa MD Attending [facility])

## 2016-07-19 ENCOUNTER — Ambulatory Visit: Payer: PRIVATE HEALTH INSURANCE

## 2016-07-19 ENCOUNTER — Encounter: Payer: Self-pay | Admitting: Physical Therapy

## 2016-07-19 DIAGNOSIS — M25551 Pain in right hip: Secondary | ICD-10-CM | POA: Diagnosis not present

## 2016-07-19 DIAGNOSIS — R262 Difficulty in walking, not elsewhere classified: Secondary | ICD-10-CM

## 2016-07-19 DIAGNOSIS — M6281 Muscle weakness (generalized): Secondary | ICD-10-CM

## 2016-07-19 NOTE — Therapy (Signed)
[facility] PHYS[facility] AND SPORTS MEDICINE [address] [place], [place], [address] Phone: [phone]   Fax:  [phone]  Physical Therapy Treatment  Patient Details  Name: Sarah ChromanMadison E Schreckengost MRN: [id number] Date of Birth: 11/16/1990 Referring Provider: Brion Alimentobert Ostrum  Encounter Date: 07/19/2016      PT End of Session - 07/19/16 1617    Visit Number 3   Number of Visits 17   Date for PT Re-Evaluation 08/04/16   PT Start Time 1615   PT Stop Time 1700   PT Time Calculation (min) 45 min   Equipment Utilized During Treatment Gait belt   Activity Tolerance Patient tolerated treatment well   Behavior [facility] for tasks assessed/performed      Past Medical History:  Diagnosis Date  . ADD (attention deficit disorder)   . Anxiety   . Cerebral palsy (HCC)   . Constipation     Past Surgical History:  Procedure Laterality Date  . HIP DEROTATIONAL OSTEOTOMY    . WISDOM TOOTH EXTRACTION      There were no vitals filed for this visit.      Subjective Assessment - 07/19/16 1616    Subjective Pt reports being very sore following last treatment session, but she feels better today.   Pertinent History Pt reports falling during the first week of school while in the bathroom. She "opened the bathroom door kinda weird and stepped into it kinda weird" and she thinks the door pushed her over, causing her to fall directly onto her R hip. Prior to the fall, she did not ambulate with an AD. She states that she was told to wear AFOs, but she just never did because they were hard to put on. She wore them until she was 25 YO. She states she did not drive prior to the fall but thinks she will be able to after rehab. She reports living with her parents, but was fully independent. She states prior to the fall, she could walk as much as she wanted to. Following surgery, [facility]; she was discharged 06/05/16. She has just been  WBAT since coming home from rehab and was trying to walk as much as possible with her FWW. She states that she has been trying to stretch it out as well. 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 She states that she needs some quad strengthening. She denies any falls since the surgery. She reports having some pain, especially after doing a lot, but for the most part it is just achy. She has a ramp at her house, but at her boyfriend's house there are 2 STE which she can ambulate with her FWW. She had a previous surgery on her RLE when she was 9 or 25 YO, where they had to replace a plate with a rod.   Limitations Walking;Standing;House hold activities   How long can you walk comfortably? 200-300 ft   Patient Stated Goals walk without AD, get back to how she was before surgery   Currently in Pain? No/denies       THEREX R sidelying hip abd very difficult for pt so regressed to clamshells with no resistance on RLE, 3x10; min cues for proper technique  Bil cone taps, 2x5 with seated rest break in between sets; increased difficulty with balancing on RLE; BUE support on FWW  TRX mini squats (  5 sec pause at bottom) x8, 2x8 with RTB around knees to decrease knee valgus; pt demo increased fatigue on RLE and increased weight shift over LLE  Leg press on TG, level 26, 2x10;  Bil single-leg leg press, x 10 with LLE on level 12, x15 on level 14; x10, x15 on level 14 with RLE; increased difficulty with RLE and demo'd increased fatigue   Resisted lateral walking at TM with RTB, x3 laps; SBA for balance and pt very challenged by exercise, requiring rest breaks in between each half lap; added to HEP but with YTB       PT Education - 07/19/16 1624    Education provided Yes   Education Details exercise technique; continue HEP, add in resisted side stepping and mini squats   Person(s) Educated Patient   Methods Explanation   Comprehension Verbalized  understanding             PT Long Term Goals - 07/07/16 1615      PT LONG TERM GOAL #1   Title Pt will have improved 6 minute walk test by >51600ft with LRAD to demonstrate improved strength, endurance, and overall function and community ambulation.   Baseline 10/18: 67228ft with FWW   Time 8   Period Weeks   Status New     PT LONG TERM GOAL #2   Title Pt will have improved 5xSTS to <10 seconds to demonstrate improved overall strength and function.   Baseline 10/18: 16.03 seconds   Time 8   Period Weeks   Status New     PT LONG TERM GOAL #3   Title Pt will have improved LEFS score by >10 points to demonstrate improved overall function.   Baseline 10/18: 21/80   Time 8   Period Weeks   Status New               Plan - 07/19/16 1712    Clinical Impression Statement Pt tolerated strengthening well this date, but continues to demonstrate increased weakness of RLE. She verbalized that she was very sore for a couple of days following last treatment session so SPT made adjustments to strengthening this date. Pt demo'd most difficulty with resisted side stepping with RTB; added to HEP with YTB. Pt needs continued skilled PT intervention to maximize overall function and decrease risk of falls.   Rehab Potential Good   Clinical Impairments Affecting Rehab Potential young, motivated, active   PT Frequency 2x / week   PT Duration 8 weeks   PT Treatment/Interventions ADLs/Self Care Home Management;Cryotherapy;Moist Heat;Ultrasound;DME Instruction;Gait training;Stair training;Functional mobility training;Therapeutic activities;Therapeutic exercise;Electrical Stimulation;Balance training;Neuromuscular re-education;Patient/family education;Orthotic Fit/Training;Manual techniques;Passive range of motion   PT Next Visit Plan strengthening, stretching, gait training   PT Home Exercise Plan 10/18: assisted SLR, bridging, and to continue her current HEP (quad sets, SAQ, supine hip abd)    Consulted and Agree with Plan of Care Patient      Patient will benefit from skilled therapeutic intervention in order to improve the following deficits and impairments:  Abnormal gait, Decreased activity tolerance, Decreased balance, Decreased mobility, Decreased endurance, Decreased range of motion, Decreased strength, Difficulty walking, Impaired flexibility  Visit Diagnosis: Pain in right hip  Difficulty in walking, not elsewhere classified  Muscle weakness (generalized)     Problem List Patient Active Problem List   Diagnosis Date Noted  . Recurrent candidiasis of vagina 07/16/2016   This entire session was performed under direct supervision and direction of a licensed therapist/therapist assistant . I  have personally read, edited and approve of the note as written.   Jac Canavan, SPT Lynnea Maizes PT, DPT   Huprich,Jason 07/20/2016, 3:09 PM  [place] [facility] PHYSICAL AND SPORTS MEDICINE [address] [place], [address] Phone: [phone]   Fax:  [phone]  Name: ARDELIA WREDE MRN: [id number] Date of Birth: Jul 17, 1991

## 2016-07-20 LAB — CYTOLOGY - PAP
CHLAMYDIA, DNA PROBE: NEGATIVE
DIAGNOSIS: NEGATIVE
NEISSERIA GONORRHEA: NEGATIVE

## 2016-07-20 LAB — WET PREP  (BD AFFIRM) (~~LOC~~): WET PREP (BD AFFIRM): POSITIVE — AB

## 2016-07-22 ENCOUNTER — Ambulatory Visit: Payer: PRIVATE HEALTH INSURANCE | Admitting: Physical Therapy

## 2016-07-26 ENCOUNTER — Ambulatory Visit: Payer: PRIVATE HEALTH INSURANCE | Admitting: Physical Therapy

## 2016-07-26 ENCOUNTER — Telehealth: Payer: Self-pay | Admitting: *Deleted

## 2016-07-26 DIAGNOSIS — B379 Candidiasis, unspecified: Secondary | ICD-10-CM

## 2016-07-26 MED ORDER — FLUCONAZOLE 150 MG PO TABS: 150.0000 mg | ORAL_TABLET | Freq: Once | ORAL | 0 refills | Status: AC

## 2016-07-26 NOTE — Telephone Encounter (Signed)
-----   Message from Olevia BowensJacinda S Battle sent at 07/26/2016  1:17 PM EST ----- Regarding: Lab Results Contact: [phone] Wants lab results from last visit

## 2016-07-26 NOTE — Telephone Encounter (Signed)
Informed pt of result and pt has started the metrogel and is using it as prescribed for chronic BV.  Also c/o ingrown hair follicles, reviewed no straight razor shaving or scratching or to wear cotton underwear that is not to tight and no underwear at night.  Pt acknowledged instructions.

## 2016-07-29 ENCOUNTER — Encounter: Payer: 59 | Admitting: Physical Therapy

## 2016-08-02 ENCOUNTER — Ambulatory Visit: Payer: PRIVATE HEALTH INSURANCE | Admitting: Physical Therapy

## 2016-08-05 ENCOUNTER — Ambulatory Visit: Payer: PRIVATE HEALTH INSURANCE | Admitting: Physical Therapy

## 2016-08-06 ENCOUNTER — Ambulatory Visit: Payer: PRIVATE HEALTH INSURANCE | Admitting: Obstetrics and Gynecology

## 2016-08-06 DIAGNOSIS — B373 Candidiasis of vulva and vagina: Secondary | ICD-10-CM

## 2016-08-06 DIAGNOSIS — B3731 Acute candidiasis of vulva and vagina: Secondary | ICD-10-CM

## 2016-08-07 NOTE — Progress Notes (Signed)
Pt here for persistent vaginitis s/s. Yeast culture from last time not done. I was going to call pt to have her come in to self swab with a different tube but pt called office and was put in for visit. Will do no charge visit given wrong tube sent. BD affirm just showed gardenerella. Pt still on the diflucan suppression and with discomfort. EGBUS with mild erythema and introitus shows white cottage cheese like d/c. [facility] rep contacted and hopefully proper tube and order put in correctly this time. Will contact pt with results and pt told may take a few weeks for yeast culture to come back. Pt also still on the metrogell and was told to continue that  Sarah Carroll, Jr MD Attending Center for [facility](Faculty Practice)

## 2016-08-16 LAB — FUNGUS CULTURE W SMEAR

## 2016-08-19 ENCOUNTER — Ambulatory Visit: Payer: PRIVATE HEALTH INSURANCE | Admitting: Physical Therapy

## 2016-08-19 ENCOUNTER — Telehealth: Payer: Self-pay | Admitting: Physical Therapy

## 2016-08-19 NOTE — Telephone Encounter (Signed)
Primary therapist called patient's home and cell numbers listed in the system. Unable to leave VM on home phone, left message on cell phone. Will continue to attempt to reach patient to discuss scheduling options.    Kerin RansomPatrick A Shamarie Call, PT, DPT

## 2016-08-23 ENCOUNTER — Encounter: Payer: PRIVATE HEALTH INSURANCE | Admitting: Obstetrics & Gynecology

## 2016-08-23 ENCOUNTER — Other Ambulatory Visit (INDEPENDENT_AMBULATORY_CARE_PROVIDER_SITE_OTHER): Payer: PRIVATE HEALTH INSURANCE

## 2016-08-23 DIAGNOSIS — N898 Other specified noninflammatory disorders of vagina: Secondary | ICD-10-CM

## 2016-08-23 DIAGNOSIS — Z309 Encounter for contraceptive management, unspecified: Secondary | ICD-10-CM

## 2016-08-23 MED ORDER — MEDROXYPROGESTERONE ACETATE 150 MG/ML IM SUSP: 150.0000 mg | Freq: Once | INTRAMUSCULAR | Status: DC

## 2016-08-23 NOTE — Progress Notes (Signed)
SUBJECTIVE:  25 y.o. female complains of white and yellow vaginal discharge for 1 week(s), which is consistent with previous complaints of recurrent vaginal yeast. She is currently taking Diflucan and Metrogel weekly without relief of symptoms.  Denies abnormal vaginal bleeding or significant pelvic pain or fever. No UTI symptoms. Denies history of known exposure to STD.  No LMP recorded. Patient is not currently having periods (Reason: Oral contraceptives).  OBJECTIVE:  She appears well, afebrile.   ASSESSMENT:  Recurrent Vaginal Discharge  PLAN:  Yeast Culture sent to lab ROV prn if symptoms persist or worsen.

## 2016-08-23 NOTE — Addendum Note (Signed)
Addended by: Arne ClevelandHUTCHINSON, MANDY J on: 08/23/2016 02:08 PM   Modules accepted: Orders

## 2016-08-23 NOTE — Addendum Note (Signed)
Addended by: Arne ClevelandHUTCHINSON, MANDY J on: 08/23/2016 01:58 PM   Modules accepted: Orders

## 2016-08-24 NOTE — Progress Notes (Signed)
This encounter was created in error - please disregard.

## 2016-09-01 ENCOUNTER — Ambulatory Visit: Payer: 59 | Attending: Orthopedic Surgery | Admitting: Physical Therapy

## 2016-09-01 DIAGNOSIS — M6281 Muscle weakness (generalized): Secondary | ICD-10-CM | POA: Insufficient documentation

## 2016-09-01 DIAGNOSIS — M25551 Pain in right hip: Secondary | ICD-10-CM

## 2016-09-01 DIAGNOSIS — R262 Difficulty in walking, not elsewhere classified: Secondary | ICD-10-CM

## 2016-09-06 NOTE — Therapy (Signed)
[facility] PHYSICAL AND SPORTS MEDICINE [address] [place], [address] Phone: [phone]   Fax:  [phone]  Physical Therapy Treatment  Patient Details  Name: CANDELA KRUL MRN: [id number] Date of Birth: 1990-11-13 Referring Provider: Brion Aliment  Encounter Date: 09/01/2016      PT End of Session - 09/06/16 1620    Visit Number 4   Number of Visits 17   Date for PT Re-Evaluation 10/14/16   PT Start Time 1115   PT Stop Time 1152   PT Time Calculation (min) 37 min   Activity Tolerance Patient tolerated treatment well   Behavior During Therapy [facility] for tasks assessed/performed      Past Medical History:  Diagnosis Date  . ADD (attention deficit disorder)   . Anxiety   . Cerebral palsy (HCC)   . Constipation     Past Surgical History:  Procedure Laterality Date  . HIP DEROTATIONAL OSTEOTOMY    . WISDOM TOOTH EXTRACTION      There were no vitals filed for this visit.      Subjective Assessment - 09/06/16 1620    Subjective Patient reports she has recently had a few personal issues and has not been as consistent with her HEP as she would like. Reports she is having less pain and is able to walk short distances aorund the home without a device. She has had two falls, but neither resulted in pain beyond a normal fall for her.    Pertinent History Pt reports falling during the first week of school while in the bathroom. She "opened the bathroom door kinda weird and stepped into it kinda weird" and she thinks the door pushed her over, causing her to fall directly onto her R hip. Prior to the fall, she did not ambulate with an AD. She states that she was told to wear AFOs, but she just never did because they were hard to put on. She wore them until she was 25 YO. She states she did not drive prior to the fall but thinks she will be able to after rehab. She reports living with her parents, but was fully independent. She states prior  to the fall, she could walk as much as she wanted to. Following surgery, she went to a rehab facility at [facility] for 1 week; she was discharged 06/05/16. She has just been WBAT since coming home from rehab and was trying to walk as much as possible with her FWW. She states that she has been trying to stretch it out as well. 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 She states that she needs some quad strengthening. She denies any falls since the surgery. She reports having some pain, especially after doing a lot, but for the most part it is just achy. She has a ramp at her house, but at her boyfriend's house there are 2 STE which she can ambulate with her FWW. She had a previous surgery on her RLE when she was 9 or 25 YO, where they had to replace a plate with a rod.   Limitations Walking;Standing;House hold activities   How long can you walk comfortably? 200-300 ft   Patient Stated Goals walk without AD, get back to how she was before surgery   Currently in Pain? No/denies        Gait observation -- Noted to initially have R foot in external rotation,  R knee flexed, decreased hip extension on R, and Trendelenburg R> L. No buckling noted. Cued to maintain knee extension in stance phase, which improved her Trendelenburg, though noted to have quad weakness (fatigue) and twitches noted (no buckling).   Step ups with RLE to step and use of 1-2 UEs for assistance, cuing to complete through knee flexion to knee extension x 12 for 2 sets (completed on LLE as well)   Standing hip abductions x 12 per side for 2 sets (noted to initially sidebend away, lean trunk forward, did not feel appropriately in her hip abductors -- cued to keep trunk upright, which she reported felt more intensely in hip abductors.   Performed mini-squats with cuing to sit back towards (by hip hinging) with a chair behind her as physical cue x 10 for 2 sets   LAQ with yellow/red t-bands x  10 for 3 sets total (to focus on R quadricep weakness)                          PT Education - 09/06/16 1620    Education provided Yes   Education Details Maintain knee in more extension throughout gait, provided feedback on exercise techniques.    Person(s) Educated Patient   Methods Explanation;Demonstration;Handout   Comprehension Verbalized understanding;Returned demonstration             PT Long Term Goals - 07/07/16 1615      PT LONG TERM GOAL #1   Title Pt will have improved 6 minute walk test by >52100ft with LRAD to demonstrate improved strength, endurance, and overall function and community ambulation.   Baseline 10/18: 68928ft with FWW   Time 8   Period Weeks   Status New     PT LONG TERM GOAL #2   Title Pt will have improved 5xSTS to <10 seconds to demonstrate improved overall strength and function.   Baseline 10/18: 16.03 seconds   Time 8   Period Weeks   Status New     PT LONG TERM GOAL #3   Title Pt will have improved LEFS score by >10 points to demonstrate improved overall function.   Baseline 10/18: 21/80   Time 8   Period Weeks   Status New               Plan - 09/06/16 1619    Clinical Impression Statement Patient presents for follow up, she reports she is no longer limited by pain, more by lack of endurance and difficulty with walking. She demonstrates significant RLE weakness, reflected in mechanics (Trendelenburg, R foot externally rotated, knee flexed throughout gait). She improved gait pattern with cuing for increased RLE knee extension throughout gait pattern (which would reduce her energy expenditure).    Rehab Potential Good   Clinical Impairments Affecting Rehab Potential young, motivated, active   PT Frequency 2x / week   PT Duration 8 weeks   PT Treatment/Interventions ADLs/Self Care Home Management;Cryotherapy;Moist Heat;Ultrasound;DME Instruction;Gait training;Stair training;Functional mobility training;Therapeutic  activities;Therapeutic exercise;Electrical Stimulation;Balance training;Neuromuscular re-education;Patient/family education;Orthotic Fit/Training;Manual techniques;Passive range of motion   PT Next Visit Plan strengthening, stretching, gait training   PT Home Exercise Plan 10/18: assisted SLR, bridging, and to continue her current HEP (quad sets, SAQ, supine hip abd)   Consulted and Agree with Plan of Care Patient      Patient will benefit from skilled therapeutic intervention in order to improve the following deficits and impairments:  Abnormal gait, Decreased activity tolerance, Decreased  balance, Decreased mobility, Decreased endurance, Decreased range of motion, Decreased strength, Difficulty walking, Impaired flexibility  Visit Diagnosis: Pain in right hip  Difficulty in walking, not elsewhere classified  Muscle weakness (generalized)     Problem List Patient Active Problem List   Diagnosis Date Noted  . Recurrent candidiasis of vagina 07/16/2016    Kerin RansomPatrick A Hadi Dubin, PT, DPT    09/06/2016, 4:21 PM  [place] [facility] PHYSICAL AND SPORTS MEDICINE [address] [place], [place], [address] Phone: [phone]   Fax:  [phone]  Name: Lauris ChromanMadison E Doorn MRN: [id number] Date of Birth: 05/14/1991

## 2016-09-15 ENCOUNTER — Ambulatory Visit: Payer: 59 | Admitting: Physical Therapy

## 2016-09-15 DIAGNOSIS — M25551 Pain in right hip: Secondary | ICD-10-CM | POA: Diagnosis not present

## 2016-09-15 DIAGNOSIS — R262 Difficulty in walking, not elsewhere classified: Secondary | ICD-10-CM

## 2016-09-15 DIAGNOSIS — M6281 Muscle weakness (generalized): Secondary | ICD-10-CM

## 2016-09-15 NOTE — Therapy (Signed)
[facility] [address] [place], [place], [address] Phone: [phone]   Fax:  [phone]  Physical Therapy Treatment  Patient Details  Name: Sarah Carroll MRN: [id number] Date of Birth: 06/11/1991 Referring Provider: Robert Ostrum  Encounter Date: 09/15/2016      PT End of Session - 09/15/16 1303    Visit Number 5   Number of Visits 17   Date for PT Re-Evaluation 10/14/16   PT Start Time 0952   PT Stop Time 1019   PT Time Calculation (min) 27 min   Activity Tolerance Patient tolerated treatment well   Behavior During Therapy WFL for tasks assessed/performed      Past Medical History:  Diagnosis Date  . ADD (attention deficit disorder)   . Anxiety   . Cerebral palsy (HCC)   . Constipation     Past Surgical History:  Procedure Laterality Date  . HIP DEROTATIONAL OSTEOTOMY    . WISDOM TOOTH EXTRACTION      There were no vitals filed for this visit.      Subjective Assessment - 09/15/16 0953    Subjective Patient reports her leg has felt weaker over the past week or so, she is not sure why. She has had difficulty finding an area or object to perform step ups. Denies any numbness or tingling down her leg. She reports she has been more sedentary than she had been.    Pertinent History Pt reports falling during the first week of school while in the bathroom. She "opened the bathroom door kinda weird and stepped into it kinda weird" and she thinks the door pushed her over, causing her to fall directly onto her R hip. Prior to the fall, she did not ambulate with an AD. She states that she was told to wear AFOs, but she just never did because they were hard to put on. She wore them until she was 25 YO. She states she did not drive prior to the fall but thinks she will be able to after rehab. She reports living with her parents, but was fully independent. She states prior to the fall, she could walk as much as she  wanted to. Following surgery, she went to a rehab facility at [facility] for 1 week; she was discharged 06/05/16. She has just been WBAT since coming home from rehab and was trying to walk as much as possible with her FWW. She states that she has been trying to stretch it out as well. 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 She states that she needs some quad strengthening. She denies any falls since the surgery. She reports having some pain, especially after doing a lot, but for the most part it is just achy. She has a ramp at her house, but at her boyfriend's house there are 2 STE which she can ambulate with her FWW. She had a previous surgery on her RLE when she was 25 or 25 YO, where they had to replace a plate with a rod.   Limitations Walking;Standing;House hold activities   How long can you walk comfortably? 200-300 ft   Patient Stated Goals walk without AD, get back to how she was before surgery   Currently in Pain? No/denies      5x sit to stand without hands - 13.7 seconds (mild weight shifting to LLE)   Observed mini - squats , had   to cue to have RLE in neutral and not in ER), able to complete to depth of chair touch chair and return to standing with bilateral UE assistance x 10   Patient reports with step ups she has not found right object or step yet to complete, educated to have knee flexed then extend to emphasize quadriceps.   Educated patient in standing calf stretch,had to cue to have RLE more posterior relative to her COM. She found more sensation of stretch with performing on the wall as opposed to in her walker.   Side stepping with red theraband x 12 per side (quite challenging for her). Attempted standing hip abduction with red band which was too difficult, more appropriate at body weight still with cuing not to lean shoulders laterally   Patient reported with SLS she was having medial thigh pain, observed her trunk was anterior to  her knee, educated to complete with trunk upright and TTWB through LLE (which reduced her symptoms).   In gait noted she continues to have RLE flexion, difficulty with flexing and advancing RLE.   Bridging in supine x 5 (challenging for her, appropriate activation/fatigue).                            PT Education - 09/15/16 1302    Education provided Yes   Education Details Strengthening takes 2-3 sessions per week for several weeks to achieve. Unable to advance exercises until more consistent exercise at home performed.    Person(s) Educated Patient   Methods Explanation;Demonstration;Handout;Verbal cues;Tactile cues   Comprehension Returned demonstration;Verbalized understanding;Verbal cues required;Tactile cues required             PT Long Term Goals - 09/15/16 1306      PT LONG TERM GOAL #1   Title Pt will have improved 6 minute walk test by >541f with LRAD to demonstrate improved strength, endurance, and overall function and community ambulation.   Baseline 10/18: 6259fwith FWW   Time 8   Period Weeks   Status On-going     PT LONG TERM GOAL #2   Title Pt will have improved 5xSTS to <10 seconds to demonstrate improved overall strength and function.   Baseline 10/18: 16.03 seconds. on 12/26 - 13.7 seconds w/o hands.    Time 8   Period Weeks   Status Partially Met     PT LONG TERM GOAL #3   Title Pt will have improved LEFS score by >10 points to demonstrate improved overall function.   Baseline 10/18: 21/80   Time 8   Period Weeks   Status On-going               Plan - 09/15/16 1016    Clinical Impression Statement Patient reporting feeling of weakness in RLE which she attributes to decreased activity level. She continues to have significant preference for knee flexion on RLE during ambulation, indicative of functional quadriceps weakness. She has been inconsistent with HEP, educated her on importance of completing HEP 2-3x per week to  optimize strength improvements. She does demonstrate improved 5x sit to stand time from initial evaluation. Given her ongoing reliance on RW and falls risk she would benefit from skilled PT services.    Rehab Potential Good   Clinical Impairments Affecting Rehab Potential young, motivated, active   PT Frequency 2x / week   PT Duration 8 weeks   PT Treatment/Interventions ADLs/Self Care Home Management;Cryotherapy;Moist Heat;Ultrasound;DME Instruction;Gait training;Stair training;Functional  mobility training;Therapeutic activities;Therapeutic exercise;Electrical Stimulation;Balance training;Neuromuscular re-education;Patient/family education;Orthotic Fit/Training;Manual techniques;Passive range of motion   PT Next Visit Plan strengthening, stretching, gait training   PT Home Exercise Plan 10/18: assisted SLR, bridging, and to continue her current HEP (quad sets, SAQ, supine hip abd)   Consulted and Agree with Plan of Care Patient      Patient will benefit from skilled therapeutic intervention in order to improve the following deficits and impairments:  Abnormal gait, Decreased activity tolerance, Decreased balance, Decreased mobility, Decreased endurance, Decreased range of motion, Decreased strength, Difficulty walking, Impaired flexibility  Visit Diagnosis: Pain in right hip  Difficulty in walking, not elsewhere classified  Muscle weakness (generalized)     Problem List Patient Active Problem List   Diagnosis Date Noted  . Recurrent candidiasis of vagina 07/16/2016   Patrick A McNamara, PT, DPT    09/15/2016, 1:07 PM  [facility] [address] , [place], [address] Phone: [phone]   Fax:  [phone]  Name: Sarah Carroll MRN: [id number] Date of Birth: 07/06/1991    

## 2016-09-27 LAB — OTHER SOLSTAS TEST: COST - REFERRED ASSAY: 20541

## 2016-10-05 ENCOUNTER — Ambulatory Visit: Payer: PRIVATE HEALTH INSURANCE | Admitting: Physical Therapy

## 2016-10-15 ENCOUNTER — Encounter: Payer: Self-pay | Admitting: Obstetrics and Gynecology

## 2016-10-15 ENCOUNTER — Ambulatory Visit (INDEPENDENT_AMBULATORY_CARE_PROVIDER_SITE_OTHER): Payer: BLUE CROSS/BLUE SHIELD | Admitting: Obstetrics and Gynecology

## 2016-10-15 VITALS — BP 139/83 | HR 98 | Resp 18 | Wt 176.0 lb

## 2016-10-15 DIAGNOSIS — N898 Other specified noninflammatory disorders of vagina: Secondary | ICD-10-CM | POA: Diagnosis not present

## 2016-10-15 DIAGNOSIS — N761 Subacute and chronic vaginitis: Secondary | ICD-10-CM | POA: Diagnosis not present

## 2016-10-15 NOTE — Progress Notes (Signed)
Obstetrics and Gynecology Visit Problem Visit Patient Evaluation  Appointment Date: 10/15/2016  [facility]  [facility] Amada Jupiter[facility] E  Chief Complaint:  Chief Complaint  Patient presents with  . Follow-up    History of Present Illness: Sarah Carroll is a 26 y.o. Caucasian G0 (Patient's last menstrual period was 09/24/2016.), seen for the above chief complaint. Her past medical history is significant for recurrent BV and yeast infections; see prior notes   She stopped her OCPs about two months and she believes her s/s are better. They are still present (itching, discharge, cottage cheese like d/c) but not as intense as before. Yeast culture self swab didn't grow anything last but not sure if correct swab was sent  [facility] she had special swab testing by another provider that came back at [facility]; she's also tried hylafem and terazol in the past  Review of Systems:  as noted in the History of Present Illness.  Past Medical History:  Past Medical History:  Diagnosis Date  . ADD (attention deficit disorder)   . Anxiety   . Cerebral palsy (HCC)   . Constipation     Past Surgical History:  Past Surgical History:  Procedure Laterality Date  . HIP DEROTATIONAL OSTEOTOMY    . WISDOM TOOTH EXTRACTION      Past Obstetrical History:  OB History  Gravida Para Term Preterm AB Living  0 0 0 0 0 0  SAB TAB Ectopic Multiple Live Births  0 0 0 0 0       Past Gynecological History: As per HPI. No h/o STIs or abnormal paps.  Last pap 06/2016 Last STD swab: 06/2016 (negative GC/CT)  Social History:  Social History   Social History  . Marital status: Significant Other    Spouse name: N/A  . Number of children: N/A  . Years of education: N/A   Occupational History  . Not on file.   Social History Main Topics  . Smoking status: Never Smoker  . Smokeless tobacco: Never Used   . Alcohol use Yes     Comment: occasional  . Drug use: No  . Sexual activity: Not Currently    Birth control/ protection: None   Other Topics Concern  . Not on file   Social History Narrative  . No narrative on file    Family History:  Family History  Problem Relation Age of Onset  . Hypertension Father   . Hypertension Mother     Medications Sarah Carroll had no medications administered during this visit. Current Outpatient Prescriptions  Medication Sig Dispense Refill  . amphetamine-dextroamphetamine (ADDERALL) 10 MG tablet Take 10 mg by mouth daily with breakfast.    . escitalopram (LEXAPRO) 10 MG tablet Take 10 mg by mouth daily.     No current facility-administered medications for this visit.     Allergies Morphine and related   Physical Exam:  BP 139/83 (BP Location: Left Arm, Patient Position: Sitting, Cuff Size: Normal)   Pulse 98   Resp 18   Wt 176 lb (79.8 kg)   LMP 09/24/2016   BMI 31.18 kg/m  Body mass index is 31.18 kg/m. General appearance: Well nourished, well developed female in no acute distress.  Respiratory:  Normal respiratory effort Abdomen: positive bowel sounds and no masses, hernias; diffusely non tender to palpation, non distended Neuro/Psych:  Normal mood and affect.  Skin:  Warm and dry.  Lymphatic:  No inguinal lymphadenopathy.   Pelvic exam: is not limited by body habitus EGBUS: with mild b/l labia erythema and cottage cheese like d/c at the vault. Deep blind swab done  Laboratory: none  Radiology: none  Assessment: patient stable to slightly improved s/s.   Plan:  We have [facility] in the office now so will send below studies. Will hold off on any treatments for now until swabs are back. Pt not currently sexually active and would like to hold off on any hormones for the time being.  nuswab VG+, candida 6 and fungal culture done today RTC PRN  Cornelia Copa MD Attending Center for [facility])

## 2016-10-19 LAB — NUSWAB VG+, CANDIDA 6SP
CANDIDA GLABRATA, NAA: NEGATIVE
CANDIDA KRUSEI, NAA: NEGATIVE
CANDIDA LUSITANIAE, NAA: NEGATIVE
CANDIDA PARAPSILOSIS, NAA: NEGATIVE
CANDIDA TROPICALIS, NAA: NEGATIVE
Candida albicans, NAA: NEGATIVE
Chlamydia trachomatis, NAA: NEGATIVE
Neisseria gonorrhoeae, NAA: NEGATIVE
TRICH VAG BY NAA: NEGATIVE

## 2016-10-19 LAB — FUNGUS CULTURE W SMEAR

## 2016-11-01 ENCOUNTER — Telehealth: Payer: Self-pay | Admitting: Obstetrics and Gynecology

## 2016-11-01 MED ORDER — BORIC ACID CRYS: 600.0000 mg | CRYSTALS | Freq: Every day | 5 refills | Status: AC

## 2016-11-01 NOTE — Telephone Encounter (Signed)
GYN Telephone Note Ms. Sarah Carroll was called and I d/w her re: the surprisingly negative swabs, but looking back at her prior GYN office swabs, she's been + for glabrata and ablicans in the past. Based on this, I told her that there is some research to indicate that boric acid suppositories or nystatin suppositories may be effective in her case and there isn't any research on maintenance of BA (AJOG 09/2014) but 2x/wk after it's use may help; she's tried diflucan induction and maintenance therapy but with no luck. I told her that the [facility] may cause some vaginal irritation and to make sure that it's in a safe place so no people or pets take it orally as it can cause death. After d/w her re: r/b/a she is amenable to starting the BA suppositories. Will send it in and patient told to call us when she starts it so I can call her when she is finishing it up to see how she's doing.   Boric acid vaginal suppositories/capsule 600mg  PV qday x 14d.  Sarah Carroll, Jr MD Attending [facility] 11/01/2016 Time: 1134am

## 2016-11-25 ENCOUNTER — Telehealth: Payer: Self-pay | Admitting: Obstetrics and Gynecology

## 2016-11-25 NOTE — Telephone Encounter (Signed)
GYN Telephone Note Patient called at [phone] and took it for 3-4 days and then had personal issues come up so went off it and now back on it for the past 2-3d, but she does state her vaginitis and irritation feel better. She states she has plenty of capsules. I told her that since her s/s are much improved, finish out doing BA 600mg  PV qday for a full two week course and then do BA 600mg  PV 2-3x/week as maintenance x 3018m and call us after that to let us know if she's still doing well. If so I told her we can send her in a year's supply. She was amenable to this.  Sarah Copaharlie Raesean Carroll, Jr MD Attending [facility] 11/25/2016 Time: 1252pm

## 2017-05-01 IMAGING — CT CT FEMUR *R* W/O CM
1 of 5 series · 3 of 14 positions shown, 4 images · non-contrast
Comparison: Radiographs dated 05/14/2016

CLINICAL DATA: Right hip pain secondary to a fall at school today.
Previous derotational osteotomy on the proximal right femur.

EXAM:
CT OF THE RIGHT FEMUR WITHOUT CONTRAST
TECHNIQUE: Multidetector CT imaging was performed according to the standard
protocol. Multiplanar CT image reconstructions were also generated.

[Series 4: axial bone · oblique · 0.43mm/px · 3 of 253 slices shown, 4 images]
[im 64/253  soft-tissue]
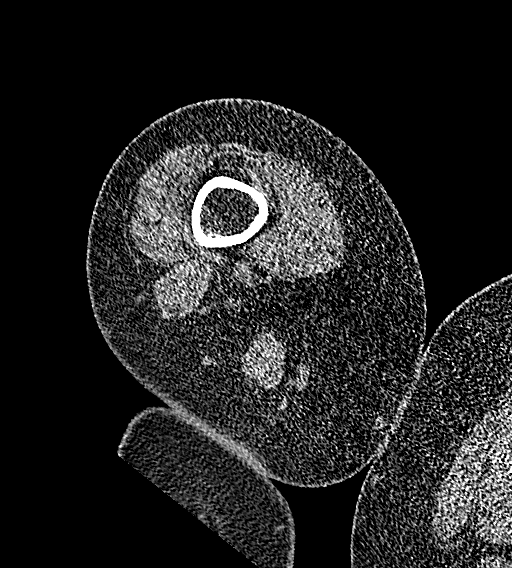
[im 64/253  bone]
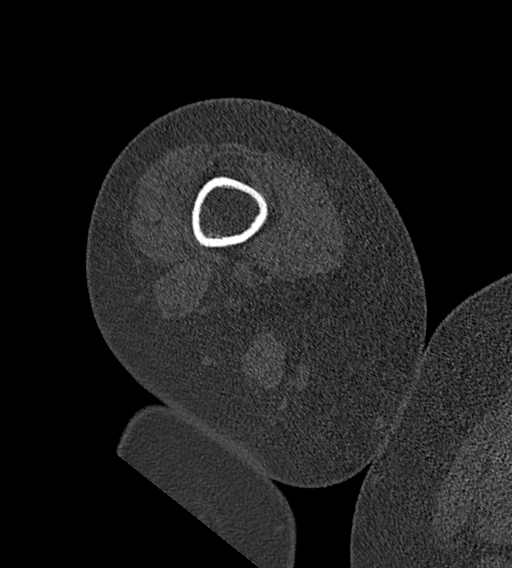
[im 127/253  bone]
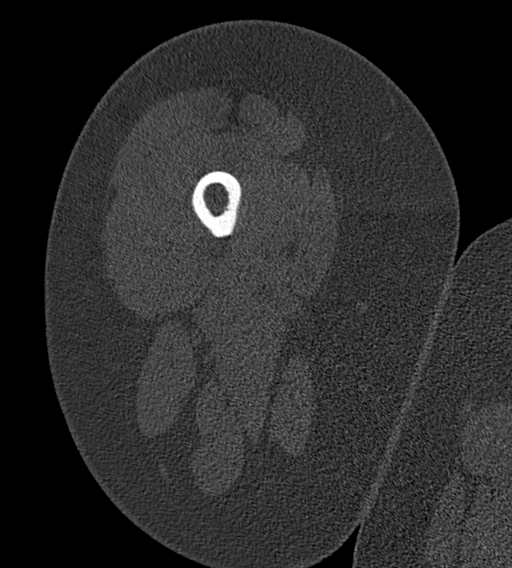
[im 190/253  bone]
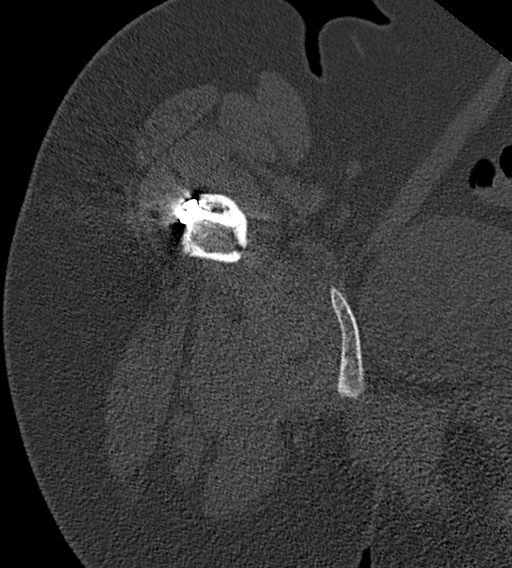

[3 of 14 positions shown; findings below may reference images not displayed]

FINDINGS: There is a comminuted slightly impacted intertrochanteric fracture
of the proximal right femur. The fracture also extends 11 cm down
the femoral shaft from the tip of the greater trochanter. There is a
slightly expansile the 7 x 4 x 4 cm cystic lesion in the greater
trochanteric area of the proximal femur extending into the right
femoral neck and shaft. This most likely represents a benign
aneurysmal bone cyst.

There is a side plate with multiple screws in the proximal right
femur. Osteotomy site has completely healed. Distal femur is
osteopenic but intact. Visualized pelvic bones are normal.
IMPRESSION: Comminuted impacted fracture of the proximal right femur. Slightly
expansile cystic bone lesion in the same area is most likely a
benign aneurysmal bone cyst.

## 2017-06-06 ENCOUNTER — Ambulatory Visit (INDEPENDENT_AMBULATORY_CARE_PROVIDER_SITE_OTHER): Payer: BLUE CROSS/BLUE SHIELD | Admitting: Obstetrics and Gynecology

## 2017-06-06 ENCOUNTER — Encounter: Payer: Self-pay | Admitting: Obstetrics and Gynecology

## 2017-06-06 VITALS — BP 136/86 | Wt 171.0 lb

## 2017-06-06 DIAGNOSIS — Z113 Encounter for screening for infections with a predominantly sexual mode of transmission: Secondary | ICD-10-CM

## 2017-06-06 DIAGNOSIS — R030 Elevated blood-pressure reading, without diagnosis of hypertension: Secondary | ICD-10-CM

## 2017-06-06 DIAGNOSIS — Z01419 Encounter for gynecological examination (general) (routine) without abnormal findings: Secondary | ICD-10-CM | POA: Diagnosis not present

## 2017-06-06 MED ORDER — NORETHIN ACE-ETH ESTRAD-FE 1-20 MG-MCG(24) PO TABS: 1.0000 | ORAL_TABLET | Freq: Every day | ORAL | 6 refills | Status: DC

## 2017-06-06 NOTE — Progress Notes (Addendum)
Obstetrics and Gynecology Annual Patient Evaluation  Appointment Date: 06/06/2017  OBGYN Clinic: [facility]  Primary Care Provider: Marina Goodell  Referring Provider: Marina Goodell, MD  Chief Complaint: Annual exam  History of Present Illness: Sarah Carroll is a 26 y.o. Caucasian G0P0000 (Patient's last menstrual period was 05/23/2017.), seen for the above chief complaint. Her past medical history is significant for recurrent BV and VVC.  Patient states that the [facility] suppositories are working well. When she has vaginitis s/s, which happens maybe once a month, she'll take the BA supp for a few days and her symptoms go away.  Patient interested in [facility] and hasn't been on OCPs (was on lo loestrin) since 08/2016.     No breast s/s, fevers, chills, chest pain, SOB, nausea, vomiting, abdominal pain, dysuria, hematuria, vaginal itching, dyspareunia,  diarrhea, constipation, blood in BMs  Review of Systems:  as noted in the History of Present Illness.  Past Medical History:  Past Medical History:  Diagnosis Date  . ADD (attention deficit disorder)   . Anxiety   . Cerebral palsy (HCC)   . Constipation     Past Surgical History:  Past Surgical History:  Procedure Laterality Date  . HIP DEROTATIONAL OSTEOTOMY    . WISDOM TOOTH EXTRACTION      Past Obstetrical History:  OB History  Gravida Para Term Preterm AB Living  0 0 0 0 0 0  SAB TAB Ectopic Multiple Live Births  0 0 0 0 0        Past Gynecological History: As per HPI. Periods: qmonth, regular, lasts for approx 5d and not particularly heavy or painful. No intermenstrual bleeding History of Pap Smear(s): Yes.   Last pap 06/2016, which was NILM History of STI(s): No. She is currently using nothing for contraception.   Social History:  Social History   Social History  . Marital status: Significant Other    Spouse name: N/A  . Number of children: N/A  . Years of education: N/A    Occupational History  . Not on file.   Social History Main Topics  . Smoking status: Never Smoker  . Smokeless tobacco: Never Used  . Alcohol use Yes     Comment: occasional  . Drug use: No  . Sexual activity: Not Currently    Birth control/ protection: None   Other Topics Concern  . Not on file   Social History Narrative  . No narrative on file    Family History:  Family History  Problem Relation Age of Onset  . Hypertension Father   . Hypertension Mother     Medications [name] had no medications administered during this visit. Current Outpatient Prescriptions  Medication Sig Dispense Refill  . amphetamine-dextroamphetamine (ADDERALL) 10 MG tablet Take 10 mg by mouth daily with breakfast.    . escitalopram (LEXAPRO) 10 MG tablet Take 10 mg by mouth daily.     No current facility-administered medications for this visit.     Allergies Morphine and related   Physical Exam:  BP 136/86   Wt 171 lb (77.6 kg)   LMP 05/23/2017   BMI 30.29 kg/m  Body mass index is 30.29 kg/m. General appearance: Well nourished, well developed female in no acute distress.  Neck:  Supple, normal appearance, and no thyromegaly  Cardiovascular: normal s1 and s2.  No murmurs, rubs or gallops. Respiratory:  Clear to auscultation bilateral. Normal respiratory effort Abdomen: positive bowel sounds and no masses, hernias;  diffusely non tender to palpation, non distended Neuro/Psych:  Normal mood and affect.  Skin:  Warm and dry.  Lymphatic:  No inguinal lymphadenopathy.   Pelvic exam: is not limited by body habitus EGBUS: within normal limits, Vagina: within normal limits and with no blood or discharge in the vault, Cervix: normal appearing cervix without tenderness, discharge or lesions. Uterus:  nonenlarged and non tender and Adnexa:  normal adnexa and no mass, fullness, tenderness Rectovaginal: deferred  Laboratory: none  Radiology: none  Assessment: pt doing well  Plan:  1.  Encounter for gynecological examination (general) (routine) without abnormal findings STI swab testing today. Options BC d/w pt. 1st BP slightly elevated. Pt would like to do OCPs and maybe consider nexplanon. Possible SE again d/w her. 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  Will do loestrin since lo loestrin was expensive for her and see back in 8 weeks for f/u and BP check. Pt to start OCPs in two weeks after her next period.  - Cervicovaginal ancillary only  2. Transient hypertension  RTC 8 wks for BP check and f/u OCP use  Cornelia Copa MD Attending Center for [facility])

## 2017-06-08 LAB — CERVICOVAGINAL ANCILLARY ONLY
Chlamydia: NEGATIVE
NEISSERIA GONORRHEA: NEGATIVE
TRICH (WINDOWPATH): NEGATIVE

## 2017-08-01 ENCOUNTER — Ambulatory Visit: Payer: 59 | Admitting: Obstetrics and Gynecology

## 2017-08-15 ENCOUNTER — Ambulatory Visit: Payer: BLUE CROSS/BLUE SHIELD | Admitting: Obstetrics and Gynecology

## 2017-08-15 DIAGNOSIS — Z09 Encounter for follow-up examination after completed treatment for conditions other than malignant neoplasm: Secondary | ICD-10-CM

## 2018-04-05 ENCOUNTER — Encounter: Payer: Self-pay | Admitting: Radiology

## 2018-08-10 ENCOUNTER — Ambulatory Visit: Payer: BLUE CROSS/BLUE SHIELD | Admitting: Obstetrics and Gynecology

## 2018-08-22 ENCOUNTER — Encounter: Payer: Self-pay | Admitting: Obstetrics & Gynecology

## 2018-08-22 ENCOUNTER — Ambulatory Visit (INDEPENDENT_AMBULATORY_CARE_PROVIDER_SITE_OTHER): Payer: PRIVATE HEALTH INSURANCE | Admitting: Obstetrics & Gynecology

## 2018-08-22 VITALS — BP 104/72 | HR 72 | Wt 186.0 lb

## 2018-08-22 DIAGNOSIS — Z308 Encounter for other contraceptive management: Secondary | ICD-10-CM

## 2018-08-22 DIAGNOSIS — Z01419 Encounter for gynecological examination (general) (routine) without abnormal findings: Secondary | ICD-10-CM

## 2018-08-22 MED ORDER — NORELGESTROMIN-ETH ESTRADIOL 150-35 MCG/24HR TD PTWK: 1.0000 | MEDICATED_PATCH | TRANSDERMAL | 0 refills | Status: DC

## 2018-08-22 NOTE — Progress Notes (Signed)
GYNECOLOGY ANNUAL PREVENTATIVE CARE ENCOUNTER NOTE  Subjective:   Sarah Carroll is a 27 y.o. G0P0000 female here for a routine annual gynecologic exam.  Current complaints: some mild breakthrough bleeding when she forgot to take her OCPs on time in the last couple of months. Wants to consider other options she does not have to take daily.   Denies other abnormal vaginal bleeding, discharge, pelvic pain, problems with intercourse or other gynecologic concerns.    Gynecologic History Patient's last menstrual period was 08/09/2018. Contraception: OCP (estrogen/progesterone) Last Pap: 07/16/2016. Results were: normal   Obstetric History OB History  Gravida Para Term Preterm AB Living  0 0 0 0 0 0  SAB TAB Ectopic Multiple Live Births  0 0 0 0 0    Past Medical History:  Diagnosis Date  . ADD (attention deficit disorder)   . Anxiety   . Cerebral palsy (HCC)   . Constipation     Past Surgical History:  Procedure Laterality Date  . HIP DEROTATIONAL OSTEOTOMY    . WISDOM TOOTH EXTRACTION      Current Outpatient Medications on File Prior to Visit  Medication Sig Dispense Refill  . amphetamine-dextroamphetamine (ADDERALL) 10 MG tablet Take 10 mg by mouth daily with breakfast.    . escitalopram (LEXAPRO) 10 MG tablet Take 10 mg by mouth daily.    . Norethindrone Acetate-Ethinyl Estrad-FE (LOESTRIN 24 FE) 1-20 MG-MCG(24) tablet Take 1 tablet by mouth daily. 2 Package 6   No current facility-administered medications on file prior to visit.     Allergies  Allergen Reactions  . Morphine And Related Itching    Social History:  reports that she has never smoked. She has never used smokeless tobacco. She reports that she drinks alcohol. She reports that she does not use drugs.  Family History  Problem Relation Age of Onset  . Hypertension Father   . Hypertension Mother     The following portions of the patient's history were reviewed and updated as appropriate: allergies,  current medications, past family history, past medical history, past social history, past surgical history and problem list.  Review of Systems Pertinent items noted in HPI and remainder of comprehensive ROS otherwise negative.   Objective:  BP 104/72   Pulse 72   Wt 186 lb (84.4 kg)   LMP 08/09/2018   BMI 32.95 kg/m  CONSTITUTIONAL: Well-developed, well-nourished female in no acute distress.  HENT:  Normocephalic, atraumatic, External right and left ear normal. Oropharynx is clear and moist EYES: Conjunctivae and EOM are normal. Pupils are equal, round, and reactive to light. No scleral icterus.  NECK: Normal range of motion, supple, no masses.  Normal thyroid.  SKIN: Skin is warm and dry. No rash noted. Not diaphoretic. No erythema. No pallor. MUSCULOSKELETAL: Normal range of motion. No tenderness.  No cyanosis, clubbing, or edema.  2+ distal pulses. NEUROLOGIC: Alert and oriented to person, place, and time. Normal reflexes, muscle tone coordination. No cranial nerve deficit noted. PSYCHIATRIC: Normal mood and affect. Normal behavior. Normal judgment and thought content. CARDIOVASCULAR: Normal heart rate noted, regular rhythm RESPIRATORY: Clear to auscultation bilaterally. Effort and breath sounds normal, no problems with respiration noted. BREASTS: Symmetric in size. No masses, skin changes, nipple drainage, or lymphadenopathy. ABDOMEN: Soft, normal bowel sounds, no distention noted.  No tenderness, rebound or guarding.  PELVIC: Normal appearing external genitalia; normal appearing vaginal mucosa and cervix.  Some white vaginal discharge noted.  Normal uterine size, no other palpable masses, no  uterine or adnexal tenderness.  Assessment and Plan:  1. Encounter for other contraceptive management Discussed LARCs, Nuvaring and Xulane patch; discussed details about each modality. She wants to try Xulane, two sample packs given to her.  I did discuss increased risk of blood clots in legs  and lungs (venous thromboembolism) with this modality.  She will try it, and take her BP in her father's office (he is a pharmacist) in 2 weeks and send MyChart message. She will also send MyChart messages for any concerns. If she likes this, she will find out how she can obtain this for a reasonable price as she is self-pay (re[facility] vs [facility] etc), and let us know where to call in a prescription to. She was also told she may come by to see if we can give her more samples.  - norelgestromin-ethinyl estradiol [employer]ico(XULANE) 150-35 MCG/24HR transdermal patch; Place 1 patch onto the skin once a week.  Dispense: 2 patch - given as samples.  2. Well woman exam with routine gynecological exam Up-to-date on pap smear screening, needs it next year. Routine preventative health maintenance measures emphasized. Please refer to After Visit Summary for other counseling recommendations.    Jaynie CollinsUGONNA  , MD, FACOG Obstetrician & Gynecologist, [facility], [facility]

## 2018-08-22 NOTE — Patient Instructions (Addendum)
Ethinyl Estradiol; Norelgestromin skin patches What is this medicine? ETHINYL ESTRADIOL;NORELGESTROMIN (ETH in il es tra DYE ole; nor el JES troe min) skin patch is used as a contraceptive (birth control method). This medicine combines two types of female hormones, an estrogen and a progestin. This patch is used to prevent ovulation and pregnancy. This medicine may be used for other purposes; ask your health care provider or pharmacist if you have questions. COMMON BRAND NAME(S): Ortho Becky Sax What should I tell my health care provider before I take this medicine? They need to know if you have or ever had any of these conditions: -abnormal vaginal bleeding -blood vessel disease or blood clots -breast, cervical, endometrial, ovarian, liver, or uterine cancer -diabetes -gallbladder disease -heart disease or recent heart attack -high blood pressure -high cholesterol -kidney disease -liver disease -migraine headaches -stroke -systemic lupus erythematosus (SLE) -tobacco smoker -an unusual or allergic reaction to estrogens, progestins, other medicines, foods, dyes, or preservatives -pregnant or trying to get pregnant -breast-feeding How should I use this medicine? This patch is applied to the skin. Follow the directions on the prescription label. Apply to clean, dry, healthy skin on the buttock, abdomen, upper outer arm or upper torso, in a place where it will not be rubbed by tight clothing. Do not use lotions or other cosmetics on the site where the patch will go. Press the patch firmly in place for 10 seconds to ensure good contact with the skin. Change the patch every 7 days on the same day of the week for 3 weeks. You will then have a break from the patch for 1 week, after which you will apply a new patch. Do not use your medicine more often than directed. Contact your pediatrician regarding the use of this medicine in children. Special care may be needed. This medicine has been used  in female children who have started having menstrual periods. A patient package insert for the product will be given with each prescription and refill. Read this sheet carefully each time. The sheet may change frequently. Overdosage: If you think you have taken too much of this medicine contact a poison control center or emergency room at once. NOTE: This medicine is only for you. Do not share this medicine with others. What if I miss a dose? You will need to replace your patch once a week as directed. If your patch is lost or falls off, contact your health care professional for advice. You may need to use another form of birth control if your patch has been off for more than 1 day. What may interact with this medicine? Do not take this medicine with the following medication: -dasabuvir; ombitasvir; paritaprevir; ritonavir -ombitasvir; paritaprevir; ritonavir This medicine may also interact with the following medications: -acetaminophen -antibiotics or medicines for infections, especially rifampin, rifabutin, rifapentine, and griseofulvin, and possibly penicillins or tetracyclines -aprepitant -ascorbic acid (vitamin C) -atorvastatin -barbiturate medicines, such as phenobarbital -bosentan -carbamazepine -caffeine -clofibrate -cyclosporine -dantrolene -doxercalciferol -felbamate -grapefruit juice -hydrocortisone -medicines for anxiety or sleeping problems, such as diazepam or temazepam -medicines for diabetes, including pioglitazone -modafinil -mycophenolate -nefazodone -oxcarbazepine -phenytoin -prednisolone -ritonavir or other medicines for HIV infection or AIDS -rosuvastatin -selegiline -soy isoflavones supplements -St. John's wort -tamoxifen or raloxifene -theophylline -thyroid hormones -topiramate -warfarin This list may not describe all possible interactions. Give your health care provider a list of all the medicines, herbs, non-prescription drugs, or dietary  supplements you use. Also tell them if you smoke, drink alcohol, or use illegal drugs.  Some items may interact with your medicine. What should I watch for while using this medicine? Visit your doctor or health care professional for regular checks on your progress. You will need a regular breast and pelvic exam and Pap smear while on this medicine. Use an additional method of contraception during the first cycle that you use this patch. If you have any reason to think you are pregnant, stop using this medicine right away and contact your doctor or health care professional. If you are using this medicine for hormone related problems, it may take several cycles of use to see improvement in your condition. Smoking increases the risk of getting a blood clot or having a stroke while you are using hormonal birth control, especially if you are more than 25 years old. You are strongly advised not to smoke. This medicine can make your body retain fluid, making your fingers, hands, or ankles swell. Your blood pressure can go up. Contact your doctor or health care professional if you feel you are retaining fluid. This medicine can make you more sensitive to the sun. Keep out of the sun. If you cannot avoid being in the sun, wear protective clothing and use sunscreen. Do not use sun lamps or tanning beds/booths. If you wear contact lenses and notice visual changes, or if the lenses begin to feel uncomfortable, consult your eye care specialist. In some women, tenderness, swelling, or minor bleeding of the gums may occur. Notify your dentist if this happens. Brushing and flossing your teeth regularly may help limit this. See your dentist regularly and inform your dentist of the medicines you are taking. If you are going to have elective surgery or a MRI, you may need to stop using this medicine before the surgery or MRI. Consult your health care professional for advice. This medicine does not protect you against HIV  infection (AIDS) or any other sexually transmitted diseases. What side effects may I notice from receiving this medicine? Side effects that you should report to your doctor or health care professional as soon as possible: -breast tissue changes or discharge -changes in vaginal bleeding during your period or between your periods -chest pain -coughing up blood -dizziness or fainting spells -headaches or migraines -leg, arm or groin pain -severe or sudden headaches -stomach pain (severe) -sudden shortness of breath -sudden loss of coordination, especially on one side of the body -speech problems -symptoms of vaginal infection like itching, irritation or unusual discharge -tenderness in the upper abdomen -vomiting -weakness or numbness in the arms or legs, especially on one side of the body -yellowing of the eyes or skin Side effects that usually do not require medical attention (report to your doctor or health care professional if they continue or are bothersome): -breakthrough bleeding and spotting that continues beyond the 3 initial cycles of pills -breast tenderness -mood changes, anxiety, depression, frustration, anger, or emotional outbursts -increased sensitivity to sun or ultraviolet light -nausea -skin rash, acne, or brown spots on the skin -weight gain (slight) This list may not describe all possible side effects. Call your doctor for medical advice about side effects. You may report side effects to FDA at 1-800-FDA-1088. Where should I keep my medicine? Keep out of the reach of children. Store at room temperature between 15 and 30 degrees C (59 and 86 degrees F). Keep the patch in its pouch until time of use. Throw away any unused medicine after the expiration date. Dispose of used patches properly. Since a used patch may  still contain active hormones, fold the patch in half so that it sticks to itself prior to disposal. Throw away in a place where children or pets cannot  reach. NOTE: This sheet is a summary. It may not cover all possible information. If you have questions about this medicine, talk to your doctor, pharmacist, or health care provider.  2018 Elsevier/Gold Standard (2016-05-17 07:59:03) Preventive Care 18-39 Years, Female Preventive care refers to lifestyle choices and visits with your health care provider that can promote health and wellness. What does preventive care include?  A yearly physical exam. This is also called an annual well check.  Dental exams once or twice a year.  Routine eye exams. Ask your health care provider how often you should have your eyes checked.  Personal lifestyle choices, including: ? Daily care of your teeth and gums. ? Regular physical activity. ? Eating a healthy diet. ? Avoiding tobacco and drug use. ? Limiting alcohol use. ? Practicing safe sex. ? Taking vitamin and mineral supplements as recommended by your health care provider. What happens during an annual well check? The services and screenings done by your health care provider during your annual well check will depend on your age, overall health, lifestyle risk factors, and family history of disease. Counseling Your health care provider may ask you questions about your:  Alcohol use.  Tobacco use.  Drug use.  Emotional well-being.  Home and relationship well-being.  Sexual activity.  Eating habits.  Work and work Statistician.  Method of birth control.  Menstrual cycle.  Pregnancy history.  Screening You may have the following tests or measurements:  Height, weight, and BMI.  Diabetes screening. This is done by checking your blood sugar (glucose) after you have not eaten for a while (fasting).  Blood pressure.  Lipid and cholesterol levels. These may be checked every 5 years starting at age 60.  Skin check.  Hepatitis C blood test.  Hepatitis B blood test.  Sexually transmitted disease (STD) testing.  BRCA-related  cancer screening. This may be done if you have a family history of breast, ovarian, tubal, or peritoneal cancers.  Pelvic exam and Pap test. This may be done every 3 years starting at age 35. Starting at age 54, this may be done every 5 years if you have a Pap test in combination with an HPV test.  Discuss your test results, treatment options, and if necessary, the need for more tests with your health care provider. Vaccines Your health care provider may recommend certain vaccines, such as:  Influenza vaccine. This is recommended every year.  Tetanus, diphtheria, and acellular pertussis (Tdap, Td) vaccine. You may need a Td booster every 10 years.  Varicella vaccine. You may need this if you have not been vaccinated.  HPV vaccine. If you are 65 or younger, you may need three doses over 6 months.  Measles, mumps, and rubella (MMR) vaccine. You may need at least one dose of MMR. You may also need a second dose.  Pneumococcal 13-valent conjugate (PCV13) vaccine. You may need this if you have certain conditions and were not previously vaccinated.  Pneumococcal polysaccharide (PPSV23) vaccine. You may need one or two doses if you smoke cigarettes or if you have certain conditions.  Meningococcal vaccine. One dose is recommended if you are age 73-21 years and a first-year college student living in a residence hall, or if you have one of several medical conditions. You may also need additional booster doses.  Hepatitis A vaccine. You  may need this if you have certain conditions or if you travel or work in places where you may be exposed to hepatitis A.  Hepatitis B vaccine. You may need this if you have certain conditions or if you travel or work in places where you may be exposed to hepatitis B.  Haemophilus influenzae type b (Hib) vaccine. You may need this if you have certain risk factors.  Talk to your health care provider about which screenings and vaccines you need and how often you need  them. This information is not intended to replace advice given to you by your health care provider. Make sure you discuss any questions you have with your health care provider. Document Released: 11/02/2001 Document Revised: 05/26/2016 Document Reviewed: 07/08/2015 Elsevier Interactive Patient Education  [employer].

## 2019-03-20 ENCOUNTER — Other Ambulatory Visit: Payer: Self-pay | Admitting: Critical Care Medicine

## 2019-03-20 ENCOUNTER — Ambulatory Visit: Payer: Self-pay | Admitting: Licensed Clinical Social Worker

## 2019-03-20 DIAGNOSIS — Z20822 Contact with and (suspected) exposure to covid-19: Secondary | ICD-10-CM

## 2019-03-27 ENCOUNTER — Telehealth: Payer: Self-pay | Admitting: Licensed Clinical Social Worker

## 2019-03-27 LAB — NOVEL CORONAVIRUS, NAA: SARS-CoV-2, NAA: NOT DETECTED

## 2019-03-27 NOTE — Telephone Encounter (Signed)
Patient left vm for LCSW requesting an appointment due to missed appt.

## 2019-03-28 ENCOUNTER — Telehealth: Payer: Self-pay | Admitting: Licensed Clinical Social Worker

## 2019-03-28 NOTE — Telephone Encounter (Signed)
Spoke with patient and scheduled appt

## 2019-04-03 ENCOUNTER — Ambulatory Visit: Payer: Self-pay | Admitting: Licensed Clinical Social Worker

## 2019-04-03 DIAGNOSIS — F429 Obsessive-compulsive disorder, unspecified: Secondary | ICD-10-CM

## 2019-04-03 DIAGNOSIS — F411 Generalized anxiety disorder: Secondary | ICD-10-CM

## 2019-04-03 NOTE — Progress Notes (Signed)
Counselor/Therapist Progress Note  Patient ID: Sarah Carroll, MRN: [id number],    Date: 04/03/2019  Time Spent: 45 minutes   Treatment Type: Psychotherapy  Reported Symptoms: anxiety, anxious thoughts; low mood   Mental Status Exam:  Appearance:   NA     Behavior:  Appropriate  Motor:  Normal  Speech/Language:   Normal Rate  Affect:  NA  Mood:  normal  Thought process:  normal  Thought content:    WNL  Sensory/Perceptual disturbances:    WNL  Orientation:  oriented to person, place and time/date  Attention:  Good  Concentration:  Good  Memory:  WNL  Fund of knowledge:   Good  Insight:    Good  Judgment:   Good  Impulse Control:  Good   Risk Assessment: Danger to Self:  No Self-injurious Behavior: No Danger to Others: No Duty to Warn:no Physical Aggression / Violence:No  Access to Firearms a concern: No  Gang Involvement:No   Subjective: Patient was engaged and cooperative throughout the session using time effectively to discuss thoughts and feelings. Patient voices continued motivation for treatment and understanding of anxiety and depression issues related to relationships and grief. Patient is likely to benefit from future treatment because she remains motivated to decrease symptoms and improve functioning.      Interventions: Cognitive Behavioral Therapy  Established psychological safety. Engaged patient in processing current psychosocial stressors - continued patterns of low mood and anxiety symptoms due to recent breakup. Discussed patient's use of engagement in positive behaviors, including engagement with positive peers to manage symptoms. Also discussed worry time. Provided support through active listening, validation of feelings, and highlighted patient's strengths.     Diagnosis:   ICD-10-CM   1. Generalized anxiety disorder  F41.1   2. Obsessive-compulsive disorder, unspecified type  F42.9     Plan: Continue CBTs. Weekly sessions. And patient is to  continue medication management as prescribed. Patient to continue attending Al-Anon meetings as desired.   Interpreter used: NA   Milton Ferguson, LCSW

## 2019-04-10 ENCOUNTER — Ambulatory Visit: Payer: Self-pay | Admitting: Licensed Clinical Social Worker

## 2019-04-11 ENCOUNTER — Ambulatory Visit: Payer: Self-pay | Admitting: Licensed Clinical Social Worker

## 2019-04-11 DIAGNOSIS — F429 Obsessive-compulsive disorder, unspecified: Secondary | ICD-10-CM

## 2019-04-11 DIAGNOSIS — F411 Generalized anxiety disorder: Secondary | ICD-10-CM

## 2019-04-11 NOTE — Progress Notes (Signed)
Counselor/Therapist Progress Note  Patient ID: Sarah Carroll, MRN: [id number],    Date: 04/11/2019  Time Spent: 54 minutes   Treatment Type: Psychotherapy  Reported Symptoms: decreased disstress, some sadness, anxiety, anxious thoughts   Mental Status Exam:  Appearance:   NA     Behavior:  Appropriate  Motor:  NA  Speech/Language:   Normal Rate  Affect:  Appropriate  Mood:  normal  Thought process:  normal  Thought content:    WNL  Sensory/Perceptual disturbances:    WNL  Orientation:  oriented to person, place and time/date  Attention:  Good  Concentration:  Good  Memory:  WNL  Fund of knowledge:   Good  Insight:    Good  Judgment:   Good  Impulse Control:  Good   Risk Assessment: Danger to Self:  No Self-injurious Behavior: No Danger to Others: No Duty to Warn:no Physical Aggression / Violence:No  Access to Firearms a concern: No  Gang Involvement:No   Subjective: Patient was engaged and cooperative throughout the session using time effectively to discuss thoughts and feelings. Patient voices increased acceptance of breakup - leading to increased mood stability/decrease in distress. She reports continued motivation for treatment and understanding of anxiety and mood issues. Patient is likely to benefit from future treatment - a combination of talk therapy and med management because she remains motivated to decrease symptoms and improve functioning.   Interventions: Cognitive Behavioral Therapy  Established psychological safety. Engaged patient in processing current psychosocial stressors. Normalized patient's feelings of sadness related to recent breakup. Taught patient about grief related to relationships. Reviewed strategies to identify, accept, and manage mood and anxiety. Encouraged patient to continue to engage in positive peer relationships. Provided support through active listening, validation of feelings, and highlighted patient's strengths.    Diagnosis:  ICD-10-CM   1. Generalized anxiety disorder  F41.1   2. Obsessive-compulsive disorder, unspecified type  F42.9     Plan: Continue to use CBTs to address mood instability and anxiety symptoms. Continue to attend AL-Anon and engage with sponsors. Continue medication management.   Interpreter used: NA   Milton Ferguson, LCSW

## 2019-04-16 ENCOUNTER — Ambulatory Visit: Payer: Self-pay | Admitting: Licensed Clinical Social Worker

## 2019-04-16 DIAGNOSIS — F429 Obsessive-compulsive disorder, unspecified: Secondary | ICD-10-CM

## 2019-04-16 DIAGNOSIS — F411 Generalized anxiety disorder: Secondary | ICD-10-CM

## 2019-04-16 NOTE — Progress Notes (Signed)
Counselor/Therapist Progress Note  Patient ID: Sarah Carroll, MRN: [id number],    Date: 04/16/2019  Time Spent: 45 minutes    Treatment Type: Psychotherapy  Reported Symptoms: Obsessive thinking and anxiety, anxious thought patterns   Mental Status Exam:  Appearance:   NA     Behavior:  Appropriate  Motor:  NA  Speech/Language:   Normal Rate  Affect:  Appropriate  Mood:  normal  Thought process:  normal  Thought content:    WNL  Sensory/Perceptual disturbances:    WNL  Orientation:  oriented to person, place and time/date  Attention:  Good  Concentration:  Good  Memory:  WNL  Fund of knowledge:   Good  Insight:    Good  Judgment:   Good  Impulse Control:  Good   Risk Assessment: Danger to Self:  No Self-injurious Behavior: No Danger to Others: No Duty to Warn:no Physical Aggression / Violence:No  Access to Firearms a concern: No  Gang Involvement:No   Subjective: Patient was engaged and cooperative throughout the session using time effectively to release emotions and process distress. Patient voices mood stability over the past few days, but continues to report anxiety symptoms related to recent breakup. Patient voices continued motivation for treatment and understanding of increased anxiety symptoms related to breakup. Patient is likely to benefit from future treatment because she remains motivated to decrease symptoms and improve functioning. Patient reports benefit from a combination of talk-therapy, medication management, and Alanon.     Interventions: Cognitive Behavioral Therapy  Established psychological safety. Engaged patient in processing current psychosocial stressors- continued patterns of high anxiety related to breakup. Processed the benefit of activities with positive peers. Discussed activities plan for the next week. Provided support through active listening, validation of feelings, and highlighted patient's strengths.    Diagnosis:   ICD-10-CM   1.  Generalized anxiety disorder  F41.1   2. Obsessive-compulsive disorder, unspecified type  F42.9     Plan: Continue CBTs through weekly sessions. Patient to continue medication management.   Interpreter used: NA   Milton Ferguson, LCSW

## 2019-04-23 ENCOUNTER — Ambulatory Visit: Payer: Self-pay | Admitting: Licensed Clinical Social Worker

## 2019-04-23 DIAGNOSIS — F429 Obsessive-compulsive disorder, unspecified: Secondary | ICD-10-CM

## 2019-04-23 DIAGNOSIS — F411 Generalized anxiety disorder: Secondary | ICD-10-CM

## 2019-04-23 NOTE — Progress Notes (Signed)
Counselor/Therapist Progress Note  Patient ID: ADEN YOUNGMAN, MRN: [id number],    Date: 04/23/2019  Time Spent: 49 Minutes   Treatment Type: Psychotherapy  Reported Symptoms: Anxiety, anxious thoughts and worries; mild mood instabiity - low mood   Mental Status Exam:  Appearance:   NA     Behavior:  Appropriate and Sharing  Motor:  NA  Speech/Language:   Normal Rate  Affect:  Appropriate and Tearful  Mood:  sad and normal   Thought process:  normal  Thought content:    WNL  Sensory/Perceptual disturbances:    WNL  Orientation:  oriented to person, place and time/date  Attention:  Good  Concentration:  Good  Memory:  WNL  Fund of knowledge:   Good  Insight:    Good  Judgment:   Good  Impulse Control:  Good   Risk Assessment: Danger to Self:  No Self-injurious Behavior: No Danger to Others: No Duty to Warn:no Physical Aggression / Violence:No  Access to Firearms a concern: No  Gang Involvement:No   Subjective: Patient was engaged and cooperative throughout the session using time effectively to discuss thoughts and feelings. Patient voices increased symptoms of depressed mood and anxiety. She remains motivated for treatment and reports benefit of regular sessions in addressing symptoms of depressed mood and anxiety.     Interventions: Cognitive Behavioral Therapy and Mindfulness Meditation  Established psychological safety. Engaged patient  Engaged patient in processing current psychosocial stressors - challenges with grief over recent breakup leading to mood and anxiety symptoms. Used socratic questioning to restructure patient's thoughts. Provided support through active listening, validation of feelings, and highlighted patient's strengths.   Diagnosis:   ICD-10-CM   1. Generalized anxiety disorder  F41.1   2. Obsessive-compulsive disorder, unspecified type  F42.9     Plan: Continue weekly sessions using CBTs to address patient's anxiety symptoms and low mood.    Interpreter used: N/A    Milton Ferguson, LCSW

## 2019-04-30 ENCOUNTER — Ambulatory Visit: Payer: Self-pay | Admitting: Licensed Clinical Social Worker

## 2019-04-30 DIAGNOSIS — F411 Generalized anxiety disorder: Secondary | ICD-10-CM

## 2019-04-30 DIAGNOSIS — F429 Obsessive-compulsive disorder, unspecified: Secondary | ICD-10-CM

## 2019-04-30 NOTE — Progress Notes (Signed)
Counselor/Therapist Progress Note  Patient ID: Sarah Carroll, MRN: [id number],    Date: 04/30/2019  Time Spent: 45 minutes  Treatment Type: Psychotherapy  Reported Symptoms: Panic attacks, Obsessive thinking and Anxiety; depressed mood secondary to recent breakup  Mental Status Exam:  Appearance:   NA     Behavior:  Appropriate and Sharing  Motor:  NA  Speech/Language:   Normal Rate  Affect:  NA  Mood:  normal  Thought process:  normal  Thought content:    WNL  Sensory/Perceptual disturbances:    WNL  Orientation:  oriented to person, place and time/date  Attention:  Good  Concentration:  Good  Memory:  WNL  Fund of knowledge:   Good  Insight:    Good  Judgment:   Good  Impulse Control:  Good   Risk Assessment: Danger to Self:  No Self-injurious Behavior: No Danger to Others: No Duty to Warn:no Physical Aggression / Violence:No  Access to Firearms a concern: No  Gang Involvement:No   Subjective: Patient was engaged and cooperative throughout the session using time effectively to discuss thoughts and feelings. Patient voices good insight into her mood and anxiety symptoms. She reports that she benefits from regular sessions, medication management, and Alanon. Patient is likely to benefit from future treatment because she remains motivated to decrease symptoms of anxiety and depression and reports benefit of regular sessions in addressing these symptoms.   Interventions: Cognitive Behavioral Therapy  Established psychological safety. Engaged patient in processing current psychosocial stressors.Facilitated discission regarding self-esteem and discussed self-talk and use of reframeing strategies. Provided support through active listening, validation of feelings, and highlighted patient's strengths.    Diagnosis:   ICD-10-CM   1. Generalized anxiety disorder  F41.1   2. Obsessive-compulsive disorder, unspecified type  F42.9     Plan: Continue weekly sessions to address  symptoms of anxiety and depression symptoms using CBTs, Patient to follow up with psychiatrist this week and will be discussing changing meds to manage panic attacks and emotional instability.   Interpreter used: NA    Milton Ferguson, LCSW

## 2019-05-07 ENCOUNTER — Ambulatory Visit: Payer: Self-pay | Admitting: Licensed Clinical Social Worker

## 2019-05-07 DIAGNOSIS — F429 Obsessive-compulsive disorder, unspecified: Secondary | ICD-10-CM

## 2019-05-07 DIAGNOSIS — F411 Generalized anxiety disorder: Secondary | ICD-10-CM

## 2019-05-07 NOTE — Progress Notes (Signed)
Counselor/Therapist Progress Note  Patient ID: Sarah Carroll, MRN: [id number],    Date: 05/07/2019  Time Spent: 60 minutes    Treatment Type: Psychotherapy  Reported Symptoms: Feelings of Worthlessness, Hopelessness, Panic attacks, Obsessive thinking, Sleep disturbance, Appetite disturbance and anxiety, anxious thought patterns  Mental Status Exam:   Appearance:   NA     Behavior:  Appropriate and Sharing  Motor:  NA  Speech/Language:   Normal Rate  Affect:  NA  Mood:  normal  Thought process:  normal  Thought content:    WNL  Sensory/Perceptual disturbances:    WNL  Orientation:  oriented to person, place and time/date  Attention:  Good  Concentration:  Good  Memory:  WNL  Fund of knowledge:   Good  Insight:    Good  Judgment:   Good  Impulse Control:  Good   Risk Assessment: Danger to Self:  No Self-injurious Behavior: No Danger to Others: No Duty to Warn:no Physical Aggression / Violence:No  Access to Firearms a concern: No  Gang Involvement:No   Subjective: Patient was engaged and cooperative throughout the session using time effectively to discuss thoughts and feelings.  Patient reports that her psychiatrist has changed her meds from Lexapro to Zoloft. Patient continues to report mood instabiltiy and anxiety. Patient is likely to benefit from future treatment because she remains motivated to decrease symptoms and improve functioning.  Interventions: Cognitive Behavioral Therapy  Engaged patient in processing current psychosocial stressors. Explored patient's behavior of not sleeping and not eating increasing understanding of these behaviors and using guided discovery. Discussed the importance of self-care. Provided support through active listening, validation of feelings, and highlighted patient's strengths.    Diagnosis:   ICD-10-CM   1. Generalized anxiety disorder  F41.1   2. Obsessive-compulsive disorder, unspecified type  F42.9     Plan: Continue weekly  sessions using CBT strategies to address anxiety and depressive symptoms. Next session will be using Zoom. Patient to continue medication management with psychiatrist.   Interpreter used: NA   Milton Ferguson, LCSW

## 2019-05-21 ENCOUNTER — Telehealth: Payer: Self-pay | Admitting: Licensed Clinical Social Worker

## 2019-05-21 ENCOUNTER — Ambulatory Visit: Payer: Self-pay | Admitting: Licensed Clinical Social Worker

## 2019-05-21 NOTE — Telephone Encounter (Signed)
LCSW called patient two times for Zoom appointment. LCSW was able to leave one message before the vm was full.

## 2019-05-23 ENCOUNTER — Ambulatory Visit: Payer: Self-pay | Admitting: Licensed Clinical Social Worker

## 2019-05-23 DIAGNOSIS — F429 Obsessive-compulsive disorder, unspecified: Secondary | ICD-10-CM

## 2019-05-23 DIAGNOSIS — F411 Generalized anxiety disorder: Secondary | ICD-10-CM

## 2019-05-23 NOTE — Progress Notes (Signed)
Counselor/Therapist Progress Note  Patient ID: Sarah Carroll, MRN: [id number],    Date: 05/23/2019  Time Spent: 45 minutes    Treatment Type: Psychotherapy  Reported Symptoms: Panic attacks, Obsessive thinking and anxiety, anxious thought patterns   Mental Status Exam:   Appearance:   NA     Behavior:  Appropriate and Sharing  Motor:  Normal  Speech/Language:   Normal Rate  Affect:  NA  Mood:  normal  Thought process:  normal  Thought content:    WNL  Sensory/Perceptual disturbances:    WNL  Orientation:  oriented to person, place and time/date  Attention:  Good  Concentration:  Good  Memory:  WNL  Fund of knowledge:   Good  Insight:    Good  Judgment:   Good  Impulse Control:  Good   Risk Assessment: Danger to Self:  No Self-injurious Behavior: No Danger to Others: No Duty to Warn:no Physical Aggression / Violence:No  Access to Firearms a concern: No  Gang Involvement:No   Subjective: Patient was engaged and cooperative throughout the session using time effectively to discuss thoughts and feelings. Patient voiced continued motivation for treatment and understanding of anxiety and mood issues related to recent breakup. Patient is likely to benefit from future treatment because she remains motivated to decrease anxiety and mood symptoms and reports benefit of a combination of regular sessions in addressing these symptoms and medication management.     Interventions: Cognitive Behavioral Therapy  Established psychological safety. Provided supportive space encouraging emotional release and processing of current psychosocial stressors - continued patterns of anxiety/low mood due to recent breakup. Explored client's perception of challenges with accepting breakup, highlighting unhelpful thoughts leading to distress and reframeing these thoughts. Encouraged continued positive interactions and engagement in pleasurable activities. Provided support through active listening,  validation of feelings, and highlighted patient's strengths.   Diagnosis:   ICD-10-CM   1. Generalized anxiety disorder  F41.1   2. Obsessive-compulsive disorder, unspecified type  F42.9     Plan: Continue weekly sessions using CBT strategies to address anxiety and depressive symptoms. Patient is to call LCSW to schedule next appointment with in 1-2 weeks. Patient to continue medication management with psychiatrist.   Interpreter used: NA   Milton Ferguson, LCSW

## 2019-06-05 ENCOUNTER — Telehealth: Payer: Self-pay | Admitting: Licensed Clinical Social Worker

## 2019-06-05 NOTE — Telephone Encounter (Signed)
Patient left vm requesting appointment. LCSW returned call and left vm for patient to return call.  

## 2019-06-11 ENCOUNTER — Ambulatory Visit: Payer: Self-pay | Admitting: Licensed Clinical Social Worker

## 2019-06-11 DIAGNOSIS — F429 Obsessive-compulsive disorder, unspecified: Secondary | ICD-10-CM

## 2019-06-11 DIAGNOSIS — F411 Generalized anxiety disorder: Secondary | ICD-10-CM

## 2019-06-11 NOTE — Progress Notes (Signed)
Counselor/Therapist Progress Note  Patient ID: Sarah Carroll, MRN: [id number],    Date: 06/11/2019  Time Spent: 55 minutes   Treatment Type: Psychotherapy  Reported Symptoms: Anxiety, anxious thoughts   Mental Status Exam:  Appearance:   NA     Behavior:  Appropriate and Sharing  Motor:  Normal  Speech/Language:   Normal Rate  Affect:  Appropriate  Mood:  normal  Thought process:  normal  Thought content:    WNL  Sensory/Perceptual disturbances:    WNL  Orientation:  oriented to person, place and time/date  Attention:  Good  Concentration:  Good  Memory:  WNL  Fund of knowledge:   Good  Insight:    Good  Judgment:   Good  Impulse Control:  Good   Risk Assessment: Danger to Self:  No Self-injurious Behavior: No Danger to Others: No Duty to Warn:no Physical Aggression / Violence:No  Access to Firearms a concern: No  Gang Involvement:No   Subjective: Patient was engaged and cooperative throughout the session using time effectively to discuss thoughts and feelings.   Patient voices mood improvement and continues to have anxiety symptoms and actively uses cognitive behavioral coping skills and reports continued motivation for treatment.  Patient is likely to benefit from future treatment because she remains motivated to decrease symptoms and improve functioning and reports benefit of regular sessions in addressing these symptoms.    Interventions: Cognitive Behavioral Therapy  Checked in with patient and established psychological safety. Engaged patient in processing current psychosocial stressors - anxiety due to navigating new dating relationship. Explored patient's perceptions of new relationship, highlighting thought distortions, and identifying alternative thoughts.  Patient is tapering of Lexapro and will begin Zoloft in about 5 days. Engaged patient in reviewing progress in treatment, patient is in agreement with continuing sessions at a two week cadence at this time.   Provided support through active listening, validation of feelings, and highlighted patient's strengths.    Diagnosis:   ICD-10-CM   1. Generalized anxiety disorder  F41.1   2. Obsessive-compulsive disorder, unspecified type  F42.9     Plan: Continue sessions at a every two week cadence using CBTs to address anxiety symptoms and depressive symptoms, as indicated. Patient will continue to attend Al-Anon meetings, as needed. Patient to continue medication management with psychiatrist.  Interpreter used: NA   Milton Ferguson, LCSW

## 2019-06-25 ENCOUNTER — Ambulatory Visit: Payer: Self-pay | Admitting: Licensed Clinical Social Worker

## 2019-06-25 DIAGNOSIS — F429 Obsessive-compulsive disorder, unspecified: Secondary | ICD-10-CM

## 2019-06-25 DIAGNOSIS — F411 Generalized anxiety disorder: Secondary | ICD-10-CM

## 2019-06-25 NOTE — Progress Notes (Signed)
Counselor/Therapist Progress Note  Patient ID: Sarah Carroll, MRN: [id number],    Date: 06/25/2019  Time Spent:  35 minutes   Treatment Type: Psychotherapy  Reported Symptoms: anxiety, anxious thought patterns  Mental Status Exam:  Appearance:   Casual     Behavior:  Appropriate and Sharing  Motor:  Normal  Speech/Language:   Normal Rate  Affect:  Appropriate  Mood:  normal  Thought process:  normal  Thought content:    WNL  Sensory/Perceptual disturbances:    WNL  Orientation:  oriented to person, place and time/date  Attention:  Good  Concentration:  Good  Memory:  WNL  Fund of knowledge:   Good  Insight:    Good  Judgment:   Good  Impulse Control:  Good   Risk Assessment: Danger to Self:  No Self-injurious Behavior: No Danger to Others: No Duty to Warn:no Physical Aggression / Violence:No  Access to Firearms a concern: No  Gang Involvement:No   Subjective: Patient was engaged and cooperative throughout the session using time effectively to discuss thoughts and feelings.  Patient voices continued motivation for treatment and understanding of mental health symptoms. Patient is likely to benefit from future treatment because she remains motivated to decrease anxiety reports benefit of a combination of medication management and regular sessions in addressing her symptoms.   Interventions: Cognitive Behavioral Therapy  Established psychological safety. Provided supportive space encouraging emotional release and processing of current psychosocial stressors - continued anxiety due to daily stressors; interactions with ex-boyfriend; dating. Assisted patient in identfying her values relating to romantic relationships, and discussed living within values. Provided support through active listening, validation of feelings, and highlighted patient's strengths.    Diagnosis:   ICD-10-CM   1. Generalized anxiety disorder  F41.1   2. Obsessive-compulsive disorder, unspecified type   F42.9     Plan: Next session will be scheduled for two weeks. Continue using CBTs to address anxiety symptoms and depressive symptoms, as indicated.Patient will continue to attend Al-Anon meetings, as needed.Patient to continue medication management with psychiatrist. Patient will start Zoloft today.   Interpreter used: NA   Milton Ferguson, LCSW

## 2019-07-06 ENCOUNTER — Encounter: Payer: Self-pay | Admitting: Radiology

## 2019-07-11 ENCOUNTER — Ambulatory Visit: Payer: Self-pay | Admitting: Licensed Clinical Social Worker

## 2019-07-11 DIAGNOSIS — F429 Obsessive-compulsive disorder, unspecified: Secondary | ICD-10-CM

## 2019-07-11 DIAGNOSIS — F411 Generalized anxiety disorder: Secondary | ICD-10-CM

## 2019-07-11 NOTE — Progress Notes (Signed)
Counselor/Therapist Progress Note  Patient ID: Sarah Carroll, MRN: [id number],    Date: 07/11/2019  Time Spent: 45 minutes  Treatment Type: Psychotherapy  Reported Symptoms: overall mood stability- sadness at times; anxiousness, anxious thought patterns  Mental Status Exam:  Appearance:   Casual     Behavior:  Appropriate and Sharing  Motor:  Normal  Speech/Language:   Normal Rate  Affect:  Appropriate  Mood:  normal  Thought process:  normal  Thought content:    WNL  Sensory/Perceptual disturbances:    WNL  Orientation:  oriented to person, place and time/date  Attention:  Good  Concentration:  Good  Memory:  WNL  Fund of knowledge:   Fair  Insight:    Good  Judgment:   Good  Impulse Control:  Good   Risk Assessment: Danger to Self:  No Self-injurious Behavior: No Danger to Others: No Duty to Warn:no Physical Aggression / Violence:No  Access to Firearms a concern: No  Gang Involvement:No   Subjective: Patient was engaged and cooperative throughout the session using time effectively to discuss thoughts and feelings. Patient voices continued motivation for treatment and understanding of mental health issues. Patient is likely to benefit from future treatment because she remains motivated to decrease symptoms and improve functioning and reports benefit of regular sessions in addressing these symptoms.    Interventions: Cognitive Behavioral Therapy  Established psychological safety. Engaged patient in processing current psychosocial stressors - overall mood stability, continued anxiety. Explored patient's perception of challenges in moving on from ex-boyfriend, validating feelings. Reviewed coping skills, including self-talk and mindfulness. Provided support through active listening, validation of feelings, and highlighted patient's strengths.    Diagnosis:   ICD-10-CM   1. Generalized anxiety disorder  F41.1   2. Obsessive-compulsive disorder, unspecified type  F42.9      Plan: Next session will be scheduled for two weeks. Continue using CBTsto address anxiety symptomsand depressive symptoms, as indicated.Patientwill continue to attend Al-Anon meetings, as needed.Patient to continue medication management with psychiatrist. On 50mg  of Zoloft has psychiatry appointment.   Interpreter used: NA   Milton Ferguson, LCSW

## 2019-07-23 ENCOUNTER — Ambulatory Visit: Payer: Self-pay | Admitting: Licensed Clinical Social Worker

## 2019-07-23 DIAGNOSIS — F429 Obsessive-compulsive disorder, unspecified: Secondary | ICD-10-CM

## 2019-07-23 DIAGNOSIS — F411 Generalized anxiety disorder: Secondary | ICD-10-CM

## 2019-07-23 NOTE — Progress Notes (Signed)
Counselor/Therapist Progress Note  Patient ID: KRESTA TEMPLEMAN, MRN: [id number],    Date: 07/23/2019  Time Spent: 47 minutes   Treatment Type: Psychotherapy  Reported Symptoms: Mild anxiety and anxious thoughts, sleep disturbance - waking in middle of the night, stable mood   Mental Status Exam:  Appearance:   NA     Behavior:  Appropriate and Sharing  Motor:  NA  Speech/Language:   Normal Rate  Affect:  NA  Mood:  normal  Thought process:  normal  Thought content:    WNL  Sensory/Perceptual disturbances:    WNL  Orientation:  oriented to person, place, time/date and situation  Attention:  Good  Concentration:  Good  Memory:  WNL  Fund of knowledge:   Good  Insight:    Good  Judgment:   Good  Impulse Control:  Good   Risk Assessment: Danger to Self:  No Self-injurious Behavior: No Danger to Others: No Duty to Warn:no Physical Aggression / Violence:No  Access to Firearms a concern: No  Gang Involvement:No   Subjective: Patient was engaged and cooperative throughout the session using time effectively to discuss thoughts and feelings. Patient voices continued motivation for treatment and understanding of anxiety. Patient is likely to benefit from future treatment because she is insightful, self-aware, and remains motivated to manage symptoms and reports benefit of medication management, Alanon and regular sessions in addressing these symptoms.    Interventions: Cognitive Behavioral Therapy  Established psychological safety. Checked in with patient. Engaged patient in processing current psychosocial stressors, overall mood stability and continued anxious thoughts; romantic relationship. Explored patient's experience of fears and anxious thoughts related to romantic relationship's, praising patient for awareness of anxious thoughts and reaching out to sponsor to talk through these thoughts. Reviewed challenging thoughts. Discussed lengthening time between sessions to - 3 weeks.   Provided support through active listening, validation of feelings, and highlighted patient's strengths.   Diagnosis:   ICD-10-CM   1. Generalized anxiety disorder  F41.1   2. Obsessive-compulsive disorder, unspecified type  F42.9     Plan: Next session will be scheduled for three weeks.Continue using CBTsto address anxiety symptomsand depressive symptoms, as indicated.Patientwill continue to attend Al-Anon meetings, as needed.Patient to continue medication management with psychiatrist. starting on 100mg  of Zoloft has psychiatry appointment.   Future Appointments  Date Time Provider [place]  08/13/2019  9:00 AM Milton Ferguson, LCSW [facility] None    Interpreter used: NA  Milton Ferguson, LCSW

## 2019-08-13 ENCOUNTER — Ambulatory Visit: Payer: Self-pay | Admitting: Licensed Clinical Social Worker

## 2019-08-13 DIAGNOSIS — F429 Obsessive-compulsive disorder, unspecified: Secondary | ICD-10-CM

## 2019-08-13 DIAGNOSIS — F411 Generalized anxiety disorder: Secondary | ICD-10-CM

## 2019-08-13 NOTE — Progress Notes (Signed)
Counselor/Therapist Progress Note  Patient ID: Sarah Carroll, MRN: [id number],    Date: 08/13/2019  Time Spent: 49 minutes  Treatment Type: Psychotherapy  Reported Symptoms: mild anxiety, anxious thoughts   Mental Status Exam:  Appearance:   NA     Behavior:  Appropriate and Sharing  Motor:  NA  Speech/Language:   Normal Rate  Affect:  Appropriate  Mood:  normal  Thought process:  normal  Thought content:    WNL  Sensory/Perceptual disturbances:    WNL  Orientation:  oriented to person, place, time/date and situation  Attention:  Good  Concentration:  Good  Memory:  WNL  Fund of knowledge:   Good  Insight:    Good  Judgment:   Good  Impulse Control:  Good   Risk Assessment: Danger to Self:  No Self-injurious Behavior: No Danger to Others: No Duty to Warn:no Physical Aggression / Violence:No  Access to Firearms a concern: No  Gang Involvement:No   Subjective: Patient was engaged and cooperative throughout the session using time effectively to discuss thoughts and feelings. Patient voices continued motivation for treatment and understanding of anxiety. Patient is likely to benefit from future treatment because she remains motivated to decrease anxiety and reports benefit of a combination of medication management and regular sessions in addressing these symptoms.    Interventions: Cognitive Behavioral Therapy  Established psychological safety. Checked in with patient. Provided supportive space encouraging emotional release and processing of current psychosocial stressors -overall stable mood with mild anxiety symptoms. Encouraged patient to recognize thoughts, emotions, and behaviors, praised patient for self-reflection and insight. Reviewed strategies to notice, accept and manage emotions in a way that is consistent with her values. Provided support through active listening, validation of feelings, and highlighted patient's strengths.   Diagnosis:   ICD-10-CM   1.  Generalized anxiety disorder  F41.1   2. Obsessive-compulsive disorder, unspecified type  F42.9    Plan: Next session will be scheduled for two weeks via Zoom.Continue using CBTsto address anxiety symptomsand depressive symptoms, as indicated.Patientwill continue to attend Al-Anon meetings, as needed.Patient to continue medication management with psychiatrist.  Interpreter used: NA  Milton Ferguson, LCSW

## 2019-08-27 ENCOUNTER — Ambulatory Visit: Payer: Self-pay | Admitting: Licensed Clinical Social Worker

## 2019-09-11 ENCOUNTER — Telehealth: Payer: Self-pay | Admitting: Licensed Clinical Social Worker

## 2019-09-11 NOTE — Telephone Encounter (Signed)
LCSW attempted to return patient's vm requesting to schedule an appointment. LCSW left vm.  

## 2019-09-24 ENCOUNTER — Ambulatory Visit: Payer: Self-pay | Admitting: Licensed Clinical Social Worker

## 2019-09-24 DIAGNOSIS — F411 Generalized anxiety disorder: Secondary | ICD-10-CM

## 2019-09-24 DIAGNOSIS — F429 Obsessive-compulsive disorder, unspecified: Secondary | ICD-10-CM

## 2019-09-24 NOTE — Progress Notes (Signed)
Counselor/Therapist Progress Note  Patient ID: Sarah Carroll, MRN: [id number],    Date: 09/24/2019  Time Spent: 45 minutes   Treatment Type: Individual Therapy  Reported Symptoms: Obsessive thinking and anxiety, anxious thought patterns   Mental Status Exam:   Appearance:   NA     Behavior:  Appropriate and Sharing  Motor:  NA  Speech/Language:   Normal Rate  Affect:  NA  Mood:  normal  Thought process:  normal  Thought content:    WNL  Sensory/Perceptual disturbances:    WNL  Orientation:  oriented to person, place, time/date and situation  Attention:  Good  Concentration:  Good  Memory:  WNL  Fund of knowledge:   Good  Insight:    Good  Judgment:   Good  Impulse Control:  Good   Risk Assessment: Danger to Self:  No Self-injurious Behavior: No Danger to Others: No Duty to Warn:no Physical Aggression / Violence:No  Access to Firearms a concern: No  Gang Involvement:No   Subjective: Patient was engaged and cooperative throughout the session using time effectively to discuss thoughts and feelings. Patient voices continued motivation for treatment and understanding of anxiety. Patient is likely to benefit from future treatment because she remains motivated to decrease symptoms and improve functioning and reports benefit of regular sessions in addressing symptoms.    Interventions: Cognitive Behavioral Therapy  Established psychological safety. Engaged patient in processing current psychosocial stressors, challenges in relationship leading to elevated anxiety. Normalized clients experience of frustration, and identified origins of these feelings within the relationship. Explored patient's perception of these challenges, highlighting unhelpful thoughts and discussed self-talk. Discussed the need to get back on self-care routine, including boundaries in relationship and expressing needs/wants. Provided support through active listening, validation of feelings, and highlighted  patient's strengths.   Diagnosis:   ICD-10-CM   1. Generalized anxiety disorder  F41.1   2. Obsessive-compulsive disorder, unspecified type  F42.9    Plan: Continue using CBTsto address anxiety symptomsand depressive symptoms, as indicated.Patientwill continue to attend Al-Anon meetings, as needed.Patient to continue medication management with psychiatrist.  Future Appointments  Date Time Provider Department Center  10/09/2019  9:30 AM Kathreen Cosier, LCSW [facility] None    Interpreter used: NA  Kathreen Cosier, LCSW

## 2019-10-09 ENCOUNTER — Ambulatory Visit: Payer: Self-pay | Admitting: Licensed Clinical Social Worker

## 2019-10-09 DIAGNOSIS — F411 Generalized anxiety disorder: Secondary | ICD-10-CM

## 2019-10-09 DIAGNOSIS — F429 Obsessive-compulsive disorder, unspecified: Secondary | ICD-10-CM

## 2019-10-09 NOTE — Progress Notes (Signed)
Counselor/Therapist Progress Note  Patient ID: Sarah Carroll, MRN: [id number],    Date: 10/09/2019  Time Spent: 45 minutes   Treatment Type: Individual Therapy  Reported Symptoms: low mood; anxiety, anxious thought patterns, overwhelm  Mental Status Exam:  Appearance:   NA     Behavior:  Appropriate and Sharing  Motor:  Normal  Speech/Language:   Normal Rate  Affect:  NA  Mood:  normal  Thought process:  normal  Thought content:    WNL  Sensory/Perceptual disturbances:    WNL  Orientation:  oriented to person, place, time/date and situation  Attention:  Good  Concentration:  Good  Memory:  WNL  Fund of knowledge:   Good  Insight:    Good  Judgment:   Good  Impulse Control:  Good   Risk Assessment: Danger to Self:  No Self-injurious Behavior: No Danger to Others: No Duty to Warn:no Physical Aggression / Violence:No  Access to Firearms a concern: No  Gang Involvement:No   Subjective: Patient was engaged and cooperative throughout the session using time effectively to discuss thoughts and feelings. Patient voices continued motivation for treatment and understanding of the use of cognitive skills to manage anxiety. Patient is likely to benefit from future treatment because she remains motivated to decrease symptoms and reports benefit from use of CBTs and of regular sessions in addressing these symptoms.    Interventions: Cognitive Behavioral Therapy  Established psychological safety. Checked in with patient. Provided supportive space encouraging emotional release and processing of current psychosocial stressors - Covid stressors. Discussed and reviewed patient's use of questioning and challenging thoughts, praising patient for actively managing symptoms of anxiety. Discussed plans to move towards termination due to patient's progress in treatment. Provided support through active listening, validation of feelings, and highlighted patient's strengths.   Diagnosis:   ICD-10-CM    1. Generalized anxiety disorder  F41.1   2. Obsessive-compulsive disorder, unspecified type  F42.9    Plan: Plan to move toward termination. Continue using CBTsto address anxiety symptomsand depressive symptoms, as indicated.Patientwill continue to attend Al-Anon meetings, as needed.Patient to continue medication management with psychiatrist.  Future Appointments  Date Time Provider Department Center  10/22/2019  9:30 AM Kathreen Cosier, LCSW [facility] None     Interpreter used: NA  Kathreen Cosier, LCSW

## 2019-10-22 ENCOUNTER — Ambulatory Visit: Payer: Self-pay | Admitting: Licensed Clinical Social Worker

## 2019-10-22 DIAGNOSIS — F411 Generalized anxiety disorder: Secondary | ICD-10-CM

## 2019-10-22 DIAGNOSIS — F429 Obsessive-compulsive disorder, unspecified: Secondary | ICD-10-CM

## 2019-10-22 NOTE — Progress Notes (Signed)
Counselor/Therapist Progress Note  Patient ID: Sarah Carroll, MRN: [id number],    Date: 10/22/2019  Time Spent: 45 minutes    Treatment Type: Individual Therapy  Reported Symptoms: mild anxiety, worries/anxious thought  Mental Status Exam:  Appearance:   NA     Behavior:  Appropriate and Sharing  Motor:  NA  Speech/Language:   Normal Rate  Affect:  NA  Mood:  normal  Thought process:  normal  Thought content:    WNL  Sensory/Perceptual disturbances:    WNL  Orientation:  oriented to person, place, time/date and situation  Attention:  Good  Concentration:  Good  Memory:  WNL  Fund of knowledge:   Good  Insight:    Good  Judgment:   Good  Impulse Control:  Good   Risk Assessment: Danger to Self:  No Self-injurious Behavior: No Danger to Others: No Duty to Warn:no Physical Aggression / Violence:No  Access to Firearms a concern: No  Gang Involvement:No   Subjective: Patient was engaged and cooperative throughout the session using time effectively to discuss thoughts and feelings. Patient voices continued motivation for treatment and understanding of anxiety issues and management strategies. Patient is likely to benefit from future treatment because she remains motivated to decrease anxiety and reports benefit of a combination of regular sessions, medication management, and Al-Anon in addressing these symptoms.    Interventions: Cognitive Behavioral Therapy  Established psychological safety. Checked in with patient regarding mood and current psychosocial stressors. Reviewed strategies to identify, own, accept and manage anxiety while also continuing to act in a way consistent with her values. Engaged patient in processing current psychosocial stressor - challenges in relationship. Normalized patient's feelings of sadness, and identified origins of these feelings within the relationship. Explored patient's options for increasing honesty with friend, discussing boundaries, wants and  needs within the relationship. Reviewed HALT - hungry, angry, lonely, tired.  Continued to discuss termination. Provided support through active listening, validation of feelings, and highlighted patient's strengths.   Diagnosis:   ICD-10-CM   1. Generalized anxiety disorder  F41.1   2. Obsessive-compulsive disorder, unspecified type  F42.9     Plan:  Continue to move toward termination. Continue using CBTsto address anxiety symptomsand depressive symptoms, as indicated.Patientwill continue to attend Al-Anon meetings, as needed.Patient to continue medication management with psychiatrist.  Interpreter used: NA  Kathreen Cosier, LCSW

## 2019-11-12 ENCOUNTER — Ambulatory Visit: Payer: Self-pay | Admitting: Licensed Clinical Social Worker

## 2019-11-12 DIAGNOSIS — F429 Obsessive-compulsive disorder, unspecified: Secondary | ICD-10-CM

## 2019-11-12 DIAGNOSIS — F411 Generalized anxiety disorder: Secondary | ICD-10-CM

## 2019-11-12 NOTE — Progress Notes (Signed)
Counselor/Therapist Progress Note  Patient ID: Sarah Carroll, MRN: [id number],    Date: 11/12/2019  Time Spent:  46 Minutes   Treatment Type: Individual Therapy  Reported Symptoms: overall stable mood  Mental Status Exam:   Appearance:   NA     Behavior:  Appropriate and Sharing  Motor:  NA  Speech/Language:   Normal Rate  Affect:  NA  Mood:  normal  Thought process:  normal  Thought content:    WNL  Sensory/Perceptual disturbances:    WNL  Orientation:  oriented to person, place, time/date and situation  Attention:  Good  Concentration:  Good  Memory:  WNL  Fund of knowledge:   Good  Insight:    Good  Judgment:   Good  Impulse Control:  Good   Risk Assessment: Danger to Self:  No Self-injurious Behavior: No Danger to Others: No Duty to Warn:no Physical Aggression / Violence:No  Access to Firearms a concern: No  Gang Involvement:No   Subjective: Patient was engaged and cooperative throughout the session using time effectively to discuss thoughts and feelings. Patient voices increased emotional regulation and improvement in mood and anxiety symptoms. She reports benefit from treatment and is in agreement to terminate services at this time. Patient plans to continue al-anon meetings, engaging in support from sponsor, and psychiatric med management.    Interventions: Cognitive Behavioral Therapy  Established psychological safety.  Provided supportive space encouraging emotional release and processing of current psychosocial stressors - overall mood stability; improvements in relationships. Discussed goal setting to live within her values. Reviewed progress, relapse prevention strategies and discussed termination of services. Provided support through active listening, validation of feelings, and highlighted patient's strengths.   Diagnosis:   ICD-10-CM   1. Generalized anxiety disorder  F41.1   2. Obsessive-compulsive disorder, unspecified type  F42.9     Plan: patient  to return for services, as needed.   No future appointments.  Interpreter used: NA   Kathreen Cosier, LCSW

## 2021-10-13 DIAGNOSIS — S91309D Unspecified open wound, unspecified foot, subsequent encounter: Secondary | ICD-10-CM | POA: Diagnosis not present

## 2021-10-13 DIAGNOSIS — Z01419 Encounter for gynecological examination (general) (routine) without abnormal findings: Secondary | ICD-10-CM | POA: Diagnosis not present

## 2021-10-16 DIAGNOSIS — R69 Illness, unspecified: Secondary | ICD-10-CM | POA: Diagnosis not present

## 2021-10-16 DIAGNOSIS — F4010 Social phobia, unspecified: Secondary | ICD-10-CM | POA: Diagnosis not present

## 2021-10-19 DIAGNOSIS — L989 Disorder of the skin and subcutaneous tissue, unspecified: Secondary | ICD-10-CM | POA: Diagnosis not present

## 2021-10-19 DIAGNOSIS — B070 Plantar wart: Secondary | ICD-10-CM | POA: Diagnosis not present

## 2021-10-19 DIAGNOSIS — L97511 Non-pressure chronic ulcer of other part of right foot limited to breakdown of skin: Secondary | ICD-10-CM | POA: Diagnosis not present

## 2021-10-19 DIAGNOSIS — R234 Changes in skin texture: Secondary | ICD-10-CM | POA: Diagnosis not present

## 2021-10-19 DIAGNOSIS — M79671 Pain in right foot: Secondary | ICD-10-CM | POA: Diagnosis not present

## 2021-11-04 DIAGNOSIS — Z3046 Encounter for surveillance of implantable subdermal contraceptive: Secondary | ICD-10-CM | POA: Diagnosis not present

## 2021-12-03 DIAGNOSIS — F900 Attention-deficit hyperactivity disorder, predominantly inattentive type: Secondary | ICD-10-CM | POA: Diagnosis not present

## 2021-12-03 DIAGNOSIS — R69 Illness, unspecified: Secondary | ICD-10-CM | POA: Diagnosis not present

## 2021-12-12 ENCOUNTER — Encounter (HOSPITAL_COMMUNITY): Payer: Self-pay

## 2021-12-12 ENCOUNTER — Ambulatory Visit (HOSPITAL_COMMUNITY)
Admission: EM | Admit: 2021-12-12 | Discharge: 2021-12-12 | Disposition: A | Discharge status: Home / Self Care | Payer: 59

## 2021-12-12 ENCOUNTER — Other Ambulatory Visit: Payer: Self-pay

## 2021-12-12 DIAGNOSIS — H6591 Unspecified nonsuppurative otitis media, right ear: Secondary | ICD-10-CM

## 2021-12-12 MED ORDER — PREDNISONE 20 MG PO TABS: 40.0000 mg | ORAL_TABLET | Freq: Every day | ORAL | 0 refills | Status: AC

## 2021-12-12 NOTE — Discharge Instructions (Signed)
It appears that you have fluid in your right ear.  This is being treated with prednisone.  Follow-up if symptoms persist or worsen. ?

## 2021-12-12 NOTE — ED Triage Notes (Signed)
One week h/o  of her right ear feeling intermittently clogged and left ear itching. ?Has been taking zyrtec and sudafed.  ?

## 2021-12-12 NOTE — ED Provider Notes (Signed)
?[facility] ? ? ? ?CSN: [id number] ?Arrival date & time: 12/12/21  1407 ? ? ?  ? ?History   ?Chief Complaint ?Chief Complaint  ?Patient presents with  ? Otalgia  ?  bilateral  ? ? ?HPI ?Sarah Carroll is a 31 y.o. female.  ? ?Patient presents with 1 week history of right ear sensation of fullness and left ear itching.  Patient reports that most of the discomfort is coming from the right ear.  Denies any pain to the ear.  Denies trauma, foreign body, drainage, decreased hearing from the ear.  Denies any associated upper respiratory symptoms, cough, fever.  Patient has taken Zyrtec and Sudafed with no improvement. ? ? ?Otalgia ? ?Past Medical History:  ?Diagnosis Date  ? ADD (attention deficit disorder)   ? Anxiety   ? Cerebral palsy (HCC)   ? Constipation   ? ? ?Patient Active Problem List  ? Diagnosis Date Noted  ? Transient hypertension 06/06/2017  ? Recurrent candidiasis of vagina 07/16/2016  ? ? ?Past Surgical History:  ?Procedure Laterality Date  ? HIP DEROTATIONAL OSTEOTOMY    ? WISDOM TOOTH EXTRACTION    ? ? ?OB History   ? ? Gravida  ?0  ? Para  ?0  ? Term  ?0  ? Preterm  ?0  ? AB  ?0  ? Living  ?0  ?  ? ? SAB  ?0  ? IAB  ?0  ? Ectopic  ?0  ? Multiple  ?0  ? Live Births  ?0  ?   ?  ?  ? ? ? ?Home Medications   ? ?Prior to Admission medications   ?Medication Sig Start Date End Date Taking? Authorizing Provider  ?predniSONE (DELTASONE) 20 MG tablet Take 2 tablets (40 mg total) by mouth daily for 5 days. 12/12/21 12/17/21 Yes Gustavus Bryant, FNP  ?amphetamine-dextroamphetamine (ADDERALL) 10 MG tablet Take 10 mg by mouth daily with breakfast.    [provider]  ?escitalopram (LEXAPRO) 10 MG tablet Take 10 mg by mouth daily.    [provider]  ?etonogestrel (NEXPLANON) 68 MG IMPL implant Inject into the skin.    [provider]  ?norelgestromin-ethinyl estradiol Burr Medico) 150-35 MCG/24HR transdermal patch Place 1 patch onto the skin once a week. 08/22/18   Anyanwu, Jethro Bastos, MD   ?Norethindrone Acetate-Ethinyl Estrad-FE (LOESTRIN 24 FE) 1-20 MG-MCG(24) tablet Take 1 tablet by mouth daily. 06/06/17   [place] Bing, MD  ?sertraline (ZOLOFT) 100 MG tablet Take 150 mg by mouth daily. 12/03/21   [provider]  ? ? ?Family History ?Family History  ?Problem Relation Age of Onset  ? Hypertension Father   ? Hypertension Mother   ? ? ?Social History ?Social History  ? ?Tobacco Use  ? Smoking status: Never  ? Smokeless tobacco: Never  ?Substance Use Topics  ? Alcohol use: Yes  ?  Comment: occasional  ? Drug use: No  ? ? ? ?Allergies   ?Morphine and related ? ? ?Review of Systems ?Review of Systems ?Per HPI ? ?Physical Exam ?Triage Vital Signs ?ED Triage Vitals  ?Enc Vitals Group  ?   BP 12/12/21 1505 106/77  ?   Pulse Rate 12/12/21 1505 98  ?   Resp 12/12/21 1505 18  ?   Temp 12/12/21 1505 98.2 ?F (36.8 ?C)  ?   Temp Source 12/12/21 1505 Oral  ?   SpO2 12/12/21 1505 98 %  ?   Weight --   ?  Height --   ?   Head Circumference --   ?   Peak Flow --   ?   Pain Score 12/12/21 1501 0  ?   Pain Loc --   ?   Pain Edu? --   ?   Excl. in [place]? --   ? ?No data found. ? ?Updated Vital Signs ?BP 106/77 (BP Location: Left Arm)   Pulse 98   Temp 98.2 ?F (36.8 ?C) (Oral)   Resp 18   SpO2 98%  ? ?Visual Acuity ?Right Eye Distance:   ?Left Eye Distance:   ?Bilateral Distance:   ? ?Right Eye Near:   ?Left Eye Near:    ?Bilateral Near:    ? ?Physical Exam ?Constitutional:   ?   General: She is not in acute distress. ?   Appearance: Normal appearance. She is not toxic-appearing or diaphoretic.  ?HENT:  ?   Head: Normocephalic and atraumatic.  ?   Right Ear: Ear canal and external ear normal. No drainage, swelling or tenderness. A middle ear effusion is present. There is no impacted cerumen. No mastoid tenderness. Tympanic membrane is not perforated, erythematous or bulging.  ?   Left Ear: Tympanic membrane and ear canal normal.  ?Eyes:  ?   Extraocular Movements: Extraocular movements intact.  ?    Conjunctiva/sclera: Conjunctivae normal.  ?Pulmonary:  ?   Effort: Pulmonary effort is normal.  ?Neurological:  ?   General: No focal deficit present.  ?   Mental Status: She is alert and oriented to person, place, and time. Mental status is at baseline.  ?Psychiatric:     ?   Mood and Affect: Mood normal.     ?   Behavior: Behavior normal.     ?   Thought Content: Thought content normal.     ?   Judgment: Judgment normal.  ? ? ? ?UC Treatments / Results  ?Labs ?(all labs ordered are listed, but only abnormal results are displayed) ?Labs Reviewed - No data to display ? ?EKG ? ? ?Radiology ?No results found. ? ?Procedures ?Procedures (including critical care time) ? ?Medications Ordered in UC ?Medications - No data to display ? ?Initial Impression / Assessment and Plan / UC Course  ?I have reviewed the triage vital signs and the nursing notes. ? ?Pertinent labs & imaging results that were available during my care of the patient were reviewed by me and considered in my medical decision making (see chart for details). ? ?  ? ?Patient has right middle ear effusion.  Left ear appears normal.  Patient has already been taking antihistamines and Sudafed with no improvement so will prescribe prednisone steroid as patient has reported that she has taken this safely and tolerated well in the past.  Discussed return precautions.  Patient verbalized understanding and was agreeable with plan. ?Final Clinical Impressions(s) / UC Diagnoses  ? ?Final diagnoses:  ?Fluid level behind tympanic membrane of right ear  ? ? ? ?Discharge Instructions   ? ?  ?It appears that you have fluid in your right ear.  This is being treated with prednisone.  Follow-up if symptoms persist or worsen. ? ? ? ?ED Prescriptions   ? ? Medication Sig Dispense Auth. Provider  ? predniSONE (DELTASONE) 20 MG tablet Take 2 tablets (40 mg total) by mouth daily for 5 days. 10 tablet Ervin Knack E, [place]  ? ?  ? ?PDMP not reviewed this encounter. ?  ?Gustavus Bryant,  [place] ?12/12/21 1526 ? ?

## 2022-01-20 ENCOUNTER — Ambulatory Visit (HOSPITAL_COMMUNITY)
Admission: RE | Admit: 2022-01-20 | Discharge: 2022-01-20 | Disposition: A | Discharge status: Home / Self Care | Payer: 59 | Source: Ambulatory Visit | Attending: Family Medicine | Admitting: Family Medicine

## 2022-01-20 ENCOUNTER — Encounter (HOSPITAL_COMMUNITY): Payer: Self-pay

## 2022-01-20 ENCOUNTER — Other Ambulatory Visit: Payer: Self-pay

## 2022-01-20 VITALS — BP 127/89 | HR 81 | Temp 98.7°F | Resp 20

## 2022-01-20 DIAGNOSIS — H6981 Other specified disorders of Eustachian tube, right ear: Secondary | ICD-10-CM | POA: Diagnosis not present

## 2022-01-20 DIAGNOSIS — H938X1 Other specified disorders of right ear: Secondary | ICD-10-CM

## 2022-01-20 MED ORDER — AMOXICILLIN-POT CLAVULANATE 875-125 MG PO TABS: 1.0000 | ORAL_TABLET | Freq: Two times a day (BID) | ORAL | 0 refills | Status: DC

## 2022-01-20 NOTE — ED Triage Notes (Signed)
Right ear fullness for 1 1/2 months.  Was seen here and given prednisone.  This seemed to help for a short time and now symptoms have returned.  Patient has noticed vertigo as recently as 2 days ago.  Patient feels fullness in morning and at bedtime.   ?

## 2022-01-20 NOTE — ED Provider Notes (Signed)
?[place] ? ? ? ?CSN: [id number] ?Arrival date & time: 01/20/22  1103 ? ? ?  ? ?History   ?Chief Complaint ?Chief Complaint  ?Patient presents with  ? Ear Fullness  ?  Having vertigo and ear has been filling up randomly for about a month now. Been treated with prednisone before - Entered by patient  ? Appointment  ?  11:00  ? ? ?HPI ?Sarah Carroll is a 31 y.o. female.  ? ?Patient is here for continued right ear fullness.  Going on x 6 weeks.  No pain;  + decreased hearing.  + dizziness/vertigo.  It worse when goes to bed or in the am when she wakes.  ?She did finish prednisone 1 week ago, helped for a day or so, and then returned.  ?She is now taking allergy medication otc, no nasal spray.  ? ?Past Medical History:  ?Diagnosis Date  ? ADD (attention deficit disorder)   ? Anxiety   ? Cerebral palsy ([place])   ? Constipation   ? ? ?Patient Active Problem List  ? Diagnosis Date Noted  ? Transient hypertension 06/06/2017  ? Recurrent candidiasis of vagina 07/16/2016  ? ? ?Past Surgical History:  ?Procedure Laterality Date  ? HIP DEROTATIONAL OSTEOTOMY    ? WISDOM TOOTH EXTRACTION    ? ? ?OB History   ? ? Gravida  ?0  ? Para  ?0  ? Term  ?0  ? Preterm  ?0  ? AB  ?0  ? Living  ?0  ?  ? ? SAB  ?0  ? IAB  ?0  ? Ectopic  ?0  ? Multiple  ?0  ? Live Births  ?0  ?   ?  ?  ? ? ? ?Home Medications   ? ?Prior to Admission medications   ?Medication Sig Start Date End Date Taking? Authorizing Provider  ?amphetamine-dextroamphetamine (ADDERALL) 10 MG tablet Take 10 mg by mouth daily with breakfast.    [provider]  ?busPIRone (BUSPAR) 30 MG tablet Take by mouth.    [provider]  ?escitalopram (LEXAPRO) 10 MG tablet Take 10 mg by mouth daily. ?Patient not taking: Reported on 01/20/2022    [provider]  ?etonogestrel (NEXPLANON) 68 MG IMPL implant Inject into the skin.    [provider]  ?norelgestromin-ethinyl estradiol Marilu Favre) 150-35 MCG/24HR transdermal patch Place 1 patch onto  the skin once a week. ?Patient not taking: Reported on 01/20/2022 08/22/18   Osborne Oman, MD  ?Norethindrone Acetate-Ethinyl Estrad-FE (LOESTRIN 24 FE) 1-20 MG-MCG(24) tablet Take 1 tablet by mouth daily. ?Patient not taking: Reported on 01/20/2022 06/06/17   Aletha Halim, MD  ?sertraline (ZOLOFT) 100 MG tablet Take 150 mg by mouth daily. 12/03/21   [provider]  ? ? ?Family History ?Family History  ?Problem Relation Age of Onset  ? Hypertension Father   ? Hypertension Mother   ? ? ?Social History ?Social History  ? ?Tobacco Use  ? Smoking status: Never  ? Smokeless tobacco: Never  ?Vaping Use  ? Vaping Use: Never used  ?Substance Use Topics  ? Alcohol use: Yes  ?  Comment: occasional  ? Drug use: No  ? ? ? ?Allergies   ?Morphine and related ? ? ?Review of Systems ?Review of Systems  ?Constitutional: Negative.   ?HENT:  Positive for hearing loss. Negative for congestion, sinus pain and sore throat.   ?Respiratory: Negative.    ?Cardiovascular: Negative.   ?Gastrointestinal: Negative.   ?Genitourinary:  Negative.   ? ? ?Physical Exam ?Triage Vital Signs ?ED Triage Vitals  ?Enc Vitals Group  ?   BP 01/20/22 1121 127/89  ?   Pulse Rate 01/20/22 1121 81  ?   Resp 01/20/22 1121 20  ?   Temp 01/20/22 1121 98.7 ?F (37.1 ?C)  ?   Temp Source 01/20/22 1121 Oral  ?   SpO2 01/20/22 1121 99 %  ?   Weight --   ?   Height --   ?   Head Circumference --   ?   Peak Flow --   ?   Pain Score 01/20/22 1117 0  ?   Pain Loc --   ?   Pain Edu? --   ?   Excl. in [place]? --   ? ?No data found. ? ?Updated Vital Signs ?BP 127/89 (BP Location: Left Arm)   Pulse 81   Temp 98.7 ?F (37.1 ?C) (Oral)   Resp 20   SpO2 99%  ? ?Visual Acuity ?Right Eye Distance:   ?Left Eye Distance:   ?Bilateral Distance:   ? ?Right Eye Near:   ?Left Eye Near:    ?Bilateral Near:    ? ?Physical Exam ?Constitutional:   ?   Appearance: Normal appearance.  ?HENT:  ?   Head: Normocephalic and atraumatic.  ?   Right Ear: A middle ear effusion is present.  ?    Left Ear: Tympanic membrane normal.  ?   Nose: Nose normal.  ?Cardiovascular:  ?   Rate and Rhythm: Normal rate.  ?Pulmonary:  ?   Effort: Pulmonary effort is normal.  ?Musculoskeletal:  ?   Cervical back: Normal range of motion.  ? ? ? ?UC Treatments / Results  ?Labs ?(all labs ordered are listed, but only abnormal results are displayed) ?Labs Reviewed - No data to display ? ?EKG ? ? ?Radiology ?No results found. ? ?Procedures ?Procedures (including critical care time) ? ?Medications Ordered in UC ?Medications - No data to display ? ?Initial Impression / Assessment and Plan / UC Course  ?I have reviewed the triage vital signs and the nursing notes. ? ?Pertinent labs & imaging results that were available during my care of the patient were reviewed by me and considered in my medical decision making (see chart for details). ? ?Patient was seen today for continue ear fullness on the right.  I do not see an overt infection, but as she has had continued symptoms despite sudafed, antihistamine, and prednisone, may treat for underlying infection.  ?I have recommend her to f/u with ENT given her continued symptoms.  ?  ?Final Clinical Impressions(s) / UC Diagnoses  ? ?Final diagnoses:  ?Dysfunction of right eustachian tube  ?Sensation of fullness in right ear  ? ? ? ?Discharge Instructions   ? ?  ?You were seen today for continued ear fullness.  ?I do not see an overt infection, but will treat with augmentin given you continued symptoms.  ?I recommend you continue the antihistamine, and take a nasal spray as well (flonse).  ?I recommend you make an appointment with an ENT.  Please make an appointment at [phone].  ? ? ? ?ED Prescriptions   ? ? Medication Sig Dispense Auth. Provider  ? amoxicillin-clavulanate (AUGMENTIN) 875-125 MG tablet Take 1 tablet by mouth every 12 (twelve) hours. 14 tablet Sarah Oh, MD  ? ?  ? ?PDMP not reviewed this encounter. ?  ?Sarah Oh, MD ?01/20/22 1139 ? ?

## 2022-01-20 NOTE — Discharge Instructions (Signed)
You were seen today for continued ear fullness.  ?I do not see an overt infection, but will treat with augmentin given you continued symptoms.  ?I recommend you continue the antihistamine, and take a nasal spray as well (flonse).  ?I recommend you make an appointment with an ENT.  Please make an appointment at [phone].  ?

## 2022-01-29 DIAGNOSIS — R69 Illness, unspecified: Secondary | ICD-10-CM | POA: Diagnosis not present

## 2022-01-29 DIAGNOSIS — F900 Attention-deficit hyperactivity disorder, predominantly inattentive type: Secondary | ICD-10-CM | POA: Diagnosis not present

## 2022-02-16 DIAGNOSIS — H6981 Other specified disorders of Eustachian tube, right ear: Secondary | ICD-10-CM | POA: Diagnosis not present

## 2022-02-16 DIAGNOSIS — H6121 Impacted cerumen, right ear: Secondary | ICD-10-CM | POA: Diagnosis not present

## 2022-02-16 DIAGNOSIS — J301 Allergic rhinitis due to pollen: Secondary | ICD-10-CM | POA: Diagnosis not present

## 2022-03-31 DIAGNOSIS — R69 Illness, unspecified: Secondary | ICD-10-CM | POA: Diagnosis not present

## 2022-03-31 DIAGNOSIS — F900 Attention-deficit hyperactivity disorder, predominantly inattentive type: Secondary | ICD-10-CM | POA: Diagnosis not present

## 2022-06-03 DIAGNOSIS — R69 Illness, unspecified: Secondary | ICD-10-CM | POA: Diagnosis not present

## 2022-06-03 DIAGNOSIS — F900 Attention-deficit hyperactivity disorder, predominantly inattentive type: Secondary | ICD-10-CM | POA: Diagnosis not present

## 2022-07-06 DIAGNOSIS — F900 Attention-deficit hyperactivity disorder, predominantly inattentive type: Secondary | ICD-10-CM | POA: Diagnosis not present

## 2022-07-06 DIAGNOSIS — R69 Illness, unspecified: Secondary | ICD-10-CM | POA: Diagnosis not present

## 2022-09-28 DIAGNOSIS — R69 Illness, unspecified: Secondary | ICD-10-CM | POA: Diagnosis not present

## 2022-10-02 ENCOUNTER — Ambulatory Visit: Payer: Self-pay

## 2022-10-02 ENCOUNTER — Ambulatory Visit
Admission: EM | Admit: 2022-10-02 | Discharge: 2022-10-02 | Disposition: A | Discharge status: Home / Self Care | Payer: 59

## 2022-10-02 DIAGNOSIS — J0100 Acute maxillary sinusitis, unspecified: Secondary | ICD-10-CM

## 2022-10-02 MED ORDER — AMOXICILLIN 875 MG PO TABS: 875.0000 mg | ORAL_TABLET | Freq: Two times a day (BID) | ORAL | 0 refills | Status: AC

## 2022-10-02 NOTE — ED Triage Notes (Signed)
Patient to Urgent Care with complaints of nasal congestion, sinus pain, bilateral ear fullness, and headache.  Symptoms started approx 10 days ago.   Denies any known fevers. Had started out taking sudafed and mucinex ER.

## 2022-10-02 NOTE — Discharge Instructions (Addendum)
Take the amoxicillin as directed.  Follow up with your primary care provider if your symptoms are not improving.   ° ° °

## 2022-10-02 NOTE — ED Provider Notes (Signed)
Sarah Carroll    CSN: [id number] Arrival date & time: 10/02/22  0825      History   Chief Complaint Chief Complaint  Patient presents with   Nasal Congestion   Ear Fullness   Headache    HPI Sarah Carroll is a 32 y.o. female.  Patient presents with 10-day history of ear discomfort, congestion, sinus pressure, cough, headache.  Treatment attempted with Mucinex and Sudafed.  No fever, sore throat, chest pain, shortness of breath, or other symptoms.  Her medical history includes cerebral palsy.     The history is provided by the patient and medical records.    Past Medical History:  Diagnosis Date   ADD (attention deficit disorder)    Anxiety    Cerebral palsy ([place])    Constipation     Patient Active Problem List   Diagnosis Date Noted   Transient hypertension 06/06/2017   Recurrent candidiasis of vagina 07/16/2016    Past Surgical History:  Procedure Laterality Date   HIP DEROTATIONAL OSTEOTOMY     WISDOM TOOTH EXTRACTION      OB History     Gravida  0   Para  0   Term  0   Preterm  0   AB  0   Living  0      SAB  0   IAB  0   Ectopic  0   Multiple  0   Live Births  0            Home Medications    Prior to Admission medications   Medication Sig Start Date End Date Taking? Authorizing Provider  amoxicillin (AMOXIL) 875 MG tablet Take 1 tablet (875 mg total) by mouth 2 (two) times daily for 10 days. 10/02/22 10/12/22 Yes Sharion Balloon, NP  amphetamine-dextroamphetamine (ADDERALL) 10 MG tablet Take 10 mg by mouth daily with breakfast.    [provider]  busPIRone (BUSPAR) 30 MG tablet Take by mouth.    [provider]  etonogestrel (NEXPLANON) 68 MG IMPL implant Inject into the skin.    [provider]  sertraline (ZOLOFT) 100 MG tablet Take 150 mg by mouth daily. 12/03/21   [provider]  traZODone (DESYREL) 50 MG tablet Take 50 mg by mouth at bedtime as needed.    [provider]     Family History Family History  Problem Relation Age of Onset   Hypertension Father    Hypertension Mother     Social History Social History   Tobacco Use   Smoking status: Never   Smokeless tobacco: Never  Vaping Use   Vaping Use: Never used  Substance Use Topics   Alcohol use: Yes    Comment: occasional   Drug use: No     Allergies   Morphine and related and Other   Review of Systems Review of Systems  Constitutional:  Negative for chills and fever.  HENT:  Positive for congestion, ear pain, postnasal drip, rhinorrhea and sinus pressure. Negative for sore throat.   Respiratory:  Positive for cough. Negative for shortness of breath.   Cardiovascular:  Negative for chest pain and palpitations.  Gastrointestinal:  Negative for diarrhea and vomiting.  Skin:  Negative for color change and rash.  Neurological:  Positive for headaches.  All other systems reviewed and are negative.    Physical Exam Triage Vital Signs ED Triage Vitals  Enc Vitals Group     BP 10/02/22 0850 116/88  Pulse Rate 10/02/22 0839 (!) 107     Resp 10/02/22 0839 18     Temp 10/02/22 0839 98 F (36.7 C)     Temp src --      SpO2 10/02/22 0839 97 %     Weight 10/02/22 0847 240 lb (108.9 kg)     Height 10/02/22 0847 5\' 3"  (1.6 m)     Head Circumference --      Peak Flow --      Pain Score 10/02/22 0844 3     Pain Loc --      Pain Edu? --      Excl. in GC? --    No data found.  Updated Vital Signs BP 116/88   Pulse (!) 107   Temp 98 F (36.7 C)   Resp 18   Ht 5\' 3"  (1.6 m)   Wt 240 lb (108.9 kg)   SpO2 97%   BMI 42.51 kg/m   Visual Acuity Right Eye Distance:   Left Eye Distance:   Bilateral Distance:    Right Eye Near:   Left Eye Near:    Bilateral Near:     Physical Exam Vitals and nursing note reviewed.  Constitutional:      General: She is not in acute distress.    Appearance: She is well-developed. She is not ill-appearing.  HENT:     Right Ear: Tympanic  membrane normal.     Left Ear: Tympanic membrane normal.     Nose: Congestion present.     Mouth/Throat:     Mouth: Mucous membranes are moist.     Pharynx: Oropharynx is clear.  Cardiovascular:     Rate and Rhythm: Normal rate and regular rhythm.     Heart sounds: Normal heart sounds.  Pulmonary:     Effort: Pulmonary effort is normal. No respiratory distress.     Breath sounds: Normal breath sounds.  Musculoskeletal:     Cervical back: Neck supple.  Skin:    General: Skin is warm and dry.  Neurological:     Mental Status: She is alert.  Psychiatric:        Mood and Affect: Mood normal.        Behavior: Behavior normal.      UC Treatments / Results  Labs (all labs ordered are listed, but only abnormal results are displayed) Labs Reviewed - No data to display  EKG   Radiology No results found.  Procedures Procedures (including critical care time)  Medications Ordered in UC Medications - No data to display  Initial Impression / Assessment and Plan / UC Course  I have reviewed the triage vital signs and the nursing notes.  Pertinent labs & imaging results that were available during my care of the patient were reviewed by me and considered in my medical decision making (see chart for details).    Acute sinusitis.  Patient has been symptomatic for 10 days.  She is not improving with OTC medications.  Treating today with amoxicillin.  Discussed symptomatic treatment including Tylenol, rest, hydration.  Instructed patient to follow up with her PCP if symptoms are not improving.  She agrees to plan of care.   Final Clinical Impressions(s) / UC Diagnoses   Final diagnoses:  Acute non-recurrent maxillary sinusitis     Discharge Instructions      Take the amoxicillin as directed.  Follow up with your primary care provider if your symptoms are not improving.  ED Prescriptions     Medication Sig Dispense Auth. Provider   amoxicillin (AMOXIL) 875 MG  tablet Take 1 tablet (875 mg total) by mouth 2 (two) times daily for 10 days. 20 tablet Sharion Balloon, NP      PDMP not reviewed this encounter.   Sharion Balloon, NP 10/02/22 [id number]

## 2022-10-29 DIAGNOSIS — R69 Illness, unspecified: Secondary | ICD-10-CM | POA: Diagnosis not present

## 2022-11-19 DIAGNOSIS — F900 Attention-deficit hyperactivity disorder, predominantly inattentive type: Secondary | ICD-10-CM | POA: Diagnosis not present

## 2022-12-28 DIAGNOSIS — F900 Attention-deficit hyperactivity disorder, predominantly inattentive type: Secondary | ICD-10-CM | POA: Diagnosis not present

## 2023-02-21 DIAGNOSIS — F32A Depression, unspecified: Secondary | ICD-10-CM | POA: Diagnosis not present

## 2023-02-22 DIAGNOSIS — F900 Attention-deficit hyperactivity disorder, predominantly inattentive type: Secondary | ICD-10-CM | POA: Diagnosis not present

## 2023-03-07 DIAGNOSIS — F32A Depression, unspecified: Secondary | ICD-10-CM | POA: Diagnosis not present

## 2023-03-21 DIAGNOSIS — F32A Depression, unspecified: Secondary | ICD-10-CM | POA: Diagnosis not present

## 2023-04-04 DIAGNOSIS — F32A Depression, unspecified: Secondary | ICD-10-CM | POA: Diagnosis not present

## 2023-04-18 DIAGNOSIS — F32A Depression, unspecified: Secondary | ICD-10-CM | POA: Diagnosis not present

## 2023-05-09 DIAGNOSIS — F32A Depression, unspecified: Secondary | ICD-10-CM | POA: Diagnosis not present

## 2023-05-22 DIAGNOSIS — F32A Depression, unspecified: Secondary | ICD-10-CM | POA: Diagnosis not present

## 2023-05-26 DIAGNOSIS — F900 Attention-deficit hyperactivity disorder, predominantly inattentive type: Secondary | ICD-10-CM | POA: Diagnosis not present

## 2023-06-06 DIAGNOSIS — F32A Depression, unspecified: Secondary | ICD-10-CM | POA: Diagnosis not present

## 2023-07-25 DIAGNOSIS — F900 Attention-deficit hyperactivity disorder, predominantly inattentive type: Secondary | ICD-10-CM | POA: Diagnosis not present

## 2023-08-11 DIAGNOSIS — Z124 Encounter for screening for malignant neoplasm of cervix: Secondary | ICD-10-CM | POA: Diagnosis not present

## 2023-08-11 DIAGNOSIS — N930 Postcoital and contact bleeding: Secondary | ICD-10-CM | POA: Diagnosis not present

## 2023-08-11 DIAGNOSIS — N898 Other specified noninflammatory disorders of vagina: Secondary | ICD-10-CM | POA: Diagnosis not present

## 2023-08-11 DIAGNOSIS — Z113 Encounter for screening for infections with a predominantly sexual mode of transmission: Secondary | ICD-10-CM | POA: Diagnosis not present

## 2023-10-26 DIAGNOSIS — F419 Anxiety disorder, unspecified: Secondary | ICD-10-CM | POA: Diagnosis not present

## 2023-10-26 DIAGNOSIS — R0981 Nasal congestion: Secondary | ICD-10-CM | POA: Diagnosis not present

## 2023-10-26 DIAGNOSIS — Z6841 Body Mass Index (BMI) 40.0 and over, adult: Secondary | ICD-10-CM | POA: Diagnosis not present

## 2023-10-26 DIAGNOSIS — F900 Attention-deficit hyperactivity disorder, predominantly inattentive type: Secondary | ICD-10-CM | POA: Diagnosis not present

## 2023-10-26 DIAGNOSIS — L719 Rosacea, unspecified: Secondary | ICD-10-CM | POA: Diagnosis not present

## 2023-10-26 DIAGNOSIS — G809 Cerebral palsy, unspecified: Secondary | ICD-10-CM | POA: Diagnosis not present

## 2023-10-26 DIAGNOSIS — Z0000 Encounter for general adult medical examination without abnormal findings: Secondary | ICD-10-CM | POA: Diagnosis not present

## 2023-10-27 DIAGNOSIS — F900 Attention-deficit hyperactivity disorder, predominantly inattentive type: Secondary | ICD-10-CM | POA: Diagnosis not present

## 2023-11-01 DIAGNOSIS — Z13220 Encounter for screening for lipoid disorders: Secondary | ICD-10-CM | POA: Diagnosis not present

## 2023-11-01 DIAGNOSIS — Z0000 Encounter for general adult medical examination without abnormal findings: Secondary | ICD-10-CM | POA: Diagnosis not present

## 2023-11-15 DIAGNOSIS — N9489 Other specified conditions associated with female genital organs and menstrual cycle: Secondary | ICD-10-CM | POA: Diagnosis not present

## 2023-11-15 DIAGNOSIS — B9689 Other specified bacterial agents as the cause of diseases classified elsewhere: Secondary | ICD-10-CM | POA: Diagnosis not present

## 2023-11-15 DIAGNOSIS — N760 Acute vaginitis: Secondary | ICD-10-CM | POA: Diagnosis not present

## 2023-11-15 DIAGNOSIS — R399 Unspecified symptoms and signs involving the genitourinary system: Secondary | ICD-10-CM | POA: Diagnosis not present

## 2023-11-21 DIAGNOSIS — E875 Hyperkalemia: Secondary | ICD-10-CM | POA: Diagnosis not present

## 2023-11-23 DIAGNOSIS — F900 Attention-deficit hyperactivity disorder, predominantly inattentive type: Secondary | ICD-10-CM | POA: Diagnosis not present

## 2024-01-13 DIAGNOSIS — F900 Attention-deficit hyperactivity disorder, predominantly inattentive type: Secondary | ICD-10-CM | POA: Diagnosis not present

## 2024-09-16 ENCOUNTER — Ambulatory Visit
Admission: EM | Admit: 2024-09-16 | Discharge: 2024-09-16 | Disposition: A | Discharge status: Home / Self Care | Attending: Emergency Medicine | Admitting: Emergency Medicine

## 2024-09-16 DIAGNOSIS — R051 Acute cough: Secondary | ICD-10-CM | POA: Diagnosis not present

## 2024-09-16 DIAGNOSIS — J0100 Acute maxillary sinusitis, unspecified: Secondary | ICD-10-CM | POA: Diagnosis not present

## 2024-09-16 MED ORDER — AMOXICILLIN-POT CLAVULANATE 875-125 MG PO TABS: 1.0000 | ORAL_TABLET | Freq: Two times a day (BID) | ORAL | 0 refills | Status: AC

## 2024-09-16 MED ORDER — PROMETHAZINE-DM 6.25-15 MG/5ML PO SYRP: 5.0000 mL | ORAL_SOLUTION | Freq: Four times a day (QID) | ORAL | 0 refills | Status: AC | PRN

## 2024-09-16 NOTE — ED Provider Notes (Signed)
 " CAY RALPH PELT    CSN: [id number] Arrival date & time: 09/16/24  1308      History   Chief Complaint No chief complaint on file.   HPI Sarah Carroll is a 33 y.o. female.  Patient presents with 8-day history of congestion, cough, headache.  She states her cough is productive of green mucus.  She had fever at the onset of her symptoms but none in the last 2 to 3 days.  Treatment attempted with Sudafed, Mucinex, Benadryl, codeine cough syrup.  No chest pain, shortness of breath, vomiting, diarrhea.  Her medical history includes cerebral palsy.  The history is provided by the patient and medical records.    Past Medical History:  Diagnosis Date   ADD (attention deficit disorder)    Anxiety    Cerebral palsy ([facility])    Constipation     Patient Active Problem List   Diagnosis Date Noted   Transient hypertension 06/06/2017   Recurrent candidiasis of vagina 07/16/2016    Past Surgical History:  Procedure Laterality Date   HIP DEROTATIONAL OSTEOTOMY     WISDOM TOOTH EXTRACTION      OB History     Gravida  0   Para  0   Term  0   Preterm  0   AB  0   Living  0      SAB  0   IAB  0   Ectopic  0   Multiple  0   Live Births  0            Home Medications    Prior to Admission medications  Medication Sig Start Date End Date Taking? Authorizing Provider  amoxicillin -clavulanate (AUGMENTIN ) 875-125 MG tablet Take 1 tablet by mouth every 12 (twelve) hours. 09/16/24  Yes Corlis Burnard DEL, NP  amphetamine-dextroamphetamine (ADDERALL) 10 MG tablet Take 10 mg by mouth daily with breakfast.   Yes [provider]  busPIRone (BUSPAR) 30 MG tablet Take by mouth.   Yes [provider]  etonogestrel  (NEXPLANON ) 68 MG IMPL implant Inject into the skin.   Yes [provider]  promethazine -dextromethorphan (PROMETHAZINE -DM) 6.25-15 MG/5ML syrup Take 5 mLs by mouth 4 (four) times daily as needed. 09/16/24  Yes Corlis Burnard DEL, NP   sertraline (ZOLOFT) 100 MG tablet Take 150 mg by mouth daily. 12/03/21  Yes [provider]  traZODone (DESYREL) 50 MG tablet Take 50 mg by mouth at bedtime as needed. Patient not taking: Reported on 09/16/2024    [provider]    Family History Family History  Problem Relation Age of Onset   Hypertension Father    Hypertension Mother     Social History Social History[1]   Allergies   Morphine and Other   Review of Systems Review of Systems  Constitutional:  Positive for fever. Negative for chills.  HENT:  Positive for congestion. Negative for ear pain and sore throat.   Respiratory:  Positive for cough. Negative for shortness of breath.   Cardiovascular:  Negative for chest pain and palpitations.  Gastrointestinal:  Negative for diarrhea and vomiting.     Physical Exam Triage Vital Signs ED Triage Vitals  Encounter Vitals Group     BP 09/16/24 1445 125/86     Girls Systolic BP Percentile --      Girls Diastolic BP Percentile --      Boys Systolic BP Percentile --      Boys Diastolic BP Percentile --  Pulse Rate 09/16/24 1445 84     Resp 09/16/24 1445 18     Temp 09/16/24 1445 97.6 F (36.4 C)     Temp src --      SpO2 09/16/24 1445 98 %     Weight --      Height --      Head Circumference --      Peak Flow --      Pain Score 09/16/24 1443 7     Pain Loc --      Pain Education --      Exclude from Growth Chart --    No data found.  Updated Vital Signs BP 125/86   Pulse 84   Temp 97.6 F (36.4 C)   Resp 18   SpO2 98%   Visual Acuity Right Eye Distance:   Left Eye Distance:   Bilateral Distance:    Right Eye Near:   Left Eye Near:    Bilateral Near:     Physical Exam Constitutional:      General: She is not in acute distress. HENT:     Right Ear: Tympanic membrane normal.     Left Ear: Tympanic membrane normal.     Nose: Congestion present.     Mouth/Throat:     Mouth: Mucous membranes are moist.     Pharynx:  Oropharynx is clear.  Cardiovascular:     Rate and Rhythm: Normal rate and regular rhythm.     Heart sounds: Normal heart sounds.  Pulmonary:     Effort: Pulmonary effort is normal. No respiratory distress.     Breath sounds: Normal breath sounds. No wheezing.  Neurological:     Mental Status: She is alert.      UC Treatments / Results  Labs (all labs ordered are listed, but only abnormal results are displayed) Labs Reviewed - No data to display  EKG   Radiology No results found.  Procedures Procedures (including critical care time)  Medications Ordered in UC Medications - No data to display  Initial Impression / Assessment and Plan / UC Course  I have reviewed the triage vital signs and the nursing notes.  Pertinent labs & imaging results that were available during my care of the patient were reviewed by me and considered in my medical decision making (see chart for details).    Acute sinusitis, cough.  Afebrile and vital signs are stable.  Lungs are clear and O2 sat is 98% on room air.  Patient has been symptomatic for 8 days and is not improving with OTC treatment.  Treating today with Augmentin  and Promethazine  DM.  Precautions for drowsiness with promethazine  discussed.  Instructed her to follow-up with her PCP.  ED precautions given.  Education provided on sinus infection and cough.  She agrees to plan of care.  Final Clinical Impressions(s) / UC Diagnoses   Final diagnoses:  Acute non-recurrent maxillary sinusitis  Acute cough     Discharge Instructions      Take the Augmentin  as directed.    Take Promethazine  DM as directed for cough.  Do not drive, operate machinery, drink alcohol, or perform dangerous activities while taking this medication as it may cause drowsiness.  Follow up with your primary care provider.  Go to the emergency department if you have worsening symptoms.        ED Prescriptions     Medication Sig Dispense Auth. Provider    amoxicillin -clavulanate (AUGMENTIN ) 875-125 MG tablet Take 1 tablet by mouth  every 12 (twelve) hours. 14 tablet Corlis Burnard DEL, NP   promethazine -dextromethorphan (PROMETHAZINE -DM) 6.25-15 MG/5ML syrup Take 5 mLs by mouth 4 (four) times daily as needed. 118 mL Corlis Burnard DEL, NP      PDMP not reviewed this encounter.    [1]  Social History Tobacco Use   Smoking status: Never   Smokeless tobacco: Never  Vaping Use   Vaping status: Never Used  Substance Use Topics   Alcohol use: Yes    Comment: occasional   Drug use: No     Corlis Burnard DEL, NP 09/16/24 1524  "

## 2024-09-16 NOTE — ED Triage Notes (Signed)
 Patient to Urgent Care with complaints of nasal congestion/ cough (constant, worse at night)/ chest congestion/ headaches. Green mucus.    Symptoms x8 days.   Meds: sudafed/ benadryl/ mucinex/ dextromethorphan

## 2024-09-16 NOTE — Discharge Instructions (Addendum)
 Take the Augmentin  as directed.    Take Promethazine  DM as directed for cough.  Do not drive, operate machinery, drink alcohol, or perform dangerous activities while taking this medication as it may cause drowsiness.  Follow up with your primary care provider.  Go to the emergency department if you have worsening symptoms.
